# Patient Record
Sex: Female | Born: 1935 | Race: White | Hispanic: No | Marital: Married | Smoking: Never smoker
Health system: Southern US, Community
[De-identification: ages and names within clinical notes are randomized; demographics above are authoritative.]

## PROBLEM LIST (undated history)

## (undated) DIAGNOSIS — Z96649 Presence of unspecified artificial hip joint: Secondary | ICD-10-CM

## (undated) DIAGNOSIS — K042 Pulp degeneration: Secondary | ICD-10-CM

## (undated) DIAGNOSIS — M169 Osteoarthritis of hip, unspecified: Secondary | ICD-10-CM

## (undated) DIAGNOSIS — R4182 Altered mental status, unspecified: Secondary | ICD-10-CM

## (undated) DIAGNOSIS — I43 Cardiomyopathy in diseases classified elsewhere: Secondary | ICD-10-CM

## (undated) DIAGNOSIS — I509 Heart failure, unspecified: Secondary | ICD-10-CM

## (undated) DIAGNOSIS — N185 Chronic kidney disease, stage 5: Secondary | ICD-10-CM

## (undated) DIAGNOSIS — J9620 Acute and chronic respiratory failure, unspecified whether with hypoxia or hypercapnia: Secondary | ICD-10-CM

## (undated) DIAGNOSIS — H349 Unspecified retinal vascular occlusion: Secondary | ICD-10-CM

## (undated) DIAGNOSIS — K5900 Constipation, unspecified: Secondary | ICD-10-CM

## (undated) DIAGNOSIS — N289 Disorder of kidney and ureter, unspecified: Secondary | ICD-10-CM

## (undated) DIAGNOSIS — R7989 Other specified abnormal findings of blood chemistry: Secondary | ICD-10-CM

## (undated) DIAGNOSIS — R0902 Hypoxemia: Secondary | ICD-10-CM

## (undated) DIAGNOSIS — G4700 Insomnia, unspecified: Secondary | ICD-10-CM

## (undated) DIAGNOSIS — K2100 Gastro-esophageal reflux disease with esophagitis, without bleeding: Secondary | ICD-10-CM

## (undated) DIAGNOSIS — R269 Unspecified abnormalities of gait and mobility: Secondary | ICD-10-CM

## (undated) DIAGNOSIS — R1310 Dysphagia, unspecified: Secondary | ICD-10-CM

## (undated) DIAGNOSIS — H4010X0 Unspecified open-angle glaucoma, stage unspecified: Secondary | ICD-10-CM

## (undated) DIAGNOSIS — M810 Age-related osteoporosis without current pathological fracture: Secondary | ICD-10-CM

## (undated) DIAGNOSIS — F329 Major depressive disorder, single episode, unspecified: Secondary | ICD-10-CM

## (undated) DIAGNOSIS — E039 Hypothyroidism, unspecified: Secondary | ICD-10-CM

## (undated) DIAGNOSIS — I2510 Atherosclerotic heart disease of native coronary artery without angina pectoris: Secondary | ICD-10-CM

## (undated) DIAGNOSIS — C4490 Unspecified malignant neoplasm of skin, unspecified: Secondary | ICD-10-CM

## (undated) DIAGNOSIS — M8448XA Pathological fracture, other site, initial encounter for fracture: Secondary | ICD-10-CM

## (undated) DIAGNOSIS — F32A Depression, unspecified: Secondary | ICD-10-CM

## (undated) DIAGNOSIS — D649 Anemia, unspecified: Secondary | ICD-10-CM

## (undated) DIAGNOSIS — I4891 Unspecified atrial fibrillation: Secondary | ICD-10-CM

## (undated) DIAGNOSIS — G43909 Migraine, unspecified, not intractable, without status migrainosus: Secondary | ICD-10-CM

## (undated) DIAGNOSIS — M1610 Unilateral primary osteoarthritis, unspecified hip: Secondary | ICD-10-CM

## (undated) DIAGNOSIS — L84 Corns and callosities: Secondary | ICD-10-CM

## (undated) DIAGNOSIS — I10 Essential (primary) hypertension: Secondary | ICD-10-CM

## (undated) HISTORY — DX: Insomnia, unspecified: G47.00

## (undated) HISTORY — DX: Disorder of kidney and ureter, unspecified: N28.9

## (undated) HISTORY — DX: Major depressive disorder, single episode, unspecified: F32.9

## (undated) HISTORY — DX: Essential (primary) hypertension: I10

## (undated) HISTORY — DX: Depression, unspecified: F32.A

## (undated) HISTORY — PX: ABDOMINAL HYSTERECTOMY: SHX81

## (undated) HISTORY — DX: Unspecified retinal vascular occlusion: H34.9

## (undated) HISTORY — PX: CATARACT EXTRACTION: SUR2

## (undated) HISTORY — DX: Hypothyroidism, unspecified: E03.9

## (undated) HISTORY — DX: Gastro-esophageal reflux disease with esophagitis, without bleeding: K21.00

## (undated) HISTORY — DX: Unspecified atrial fibrillation: I48.91

## (undated) HISTORY — DX: Unspecified abnormalities of gait and mobility: R26.9

## (undated) HISTORY — DX: Pulp degeneration: K04.2

## (undated) HISTORY — DX: Unspecified open-angle glaucoma, stage unspecified: H40.10X0

## (undated) HISTORY — DX: Atherosclerotic heart disease of native coronary artery without angina pectoris: I25.10

## (undated) HISTORY — DX: Osteoarthritis of hip, unspecified: M16.9

## (undated) HISTORY — DX: Anemia, unspecified: D64.9

## (undated) HISTORY — PX: SKIN CANCER EXCISION: SHX779

## (undated) HISTORY — DX: Altered mental status, unspecified: R41.82

## (undated) HISTORY — DX: Chronic kidney disease, stage 5: N18.5

## (undated) HISTORY — DX: Hypoxemia: R09.02

## (undated) HISTORY — DX: Pathological fracture, other site, initial encounter for fracture: M84.48XA

## (undated) HISTORY — DX: Dysphagia, unspecified: R13.10

## (undated) HISTORY — DX: Unspecified malignant neoplasm of skin, unspecified: C44.90

## (undated) HISTORY — DX: Gastro-esophageal reflux disease with esophagitis: K21.0

## (undated) HISTORY — DX: Migraine, unspecified, not intractable, without status migrainosus: G43.909

## (undated) HISTORY — DX: Cardiomyopathy in diseases classified elsewhere: I43

## (undated) HISTORY — DX: Acute and chronic respiratory failure, unspecified whether with hypoxia or hypercapnia: J96.20

## (undated) HISTORY — DX: Heart failure, unspecified: I50.9

## (undated) HISTORY — DX: Corns and callosities: L84

## (undated) HISTORY — DX: Unilateral primary osteoarthritis, unspecified hip: M16.10

## (undated) HISTORY — DX: Presence of unspecified artificial hip joint: Z96.649

## (undated) HISTORY — DX: Age-related osteoporosis without current pathological fracture: M81.0

## (undated) HISTORY — DX: Other specified abnormal findings of blood chemistry: R79.89

## (undated) HISTORY — DX: Constipation, unspecified: K59.00

---

## 1997-10-25 ENCOUNTER — Emergency Department (HOSPITAL_COMMUNITY): Admission: EM | Admit: 1997-10-25 | Discharge: 1997-10-25 | Payer: Self-pay | Admitting: Emergency Medicine

## 1997-10-26 ENCOUNTER — Ambulatory Visit (HOSPITAL_COMMUNITY): Admission: RE | Admit: 1997-10-26 | Discharge: 1997-10-26 | Payer: Self-pay

## 1997-11-12 ENCOUNTER — Ambulatory Visit (HOSPITAL_COMMUNITY): Admission: RE | Admit: 1997-11-12 | Discharge: 1997-11-12 | Payer: Self-pay | Admitting: Internal Medicine

## 1998-11-01 ENCOUNTER — Other Ambulatory Visit: Admission: RE | Admit: 1998-11-01 | Discharge: 1998-11-01 | Payer: Self-pay | Admitting: Internal Medicine

## 1999-01-13 ENCOUNTER — Inpatient Hospital Stay (HOSPITAL_COMMUNITY): Admission: EM | Admit: 1999-01-13 | Discharge: 1999-01-17 | Payer: Self-pay | Admitting: Emergency Medicine

## 1999-01-13 ENCOUNTER — Encounter: Payer: Self-pay | Admitting: Emergency Medicine

## 1999-02-17 ENCOUNTER — Ambulatory Visit (HOSPITAL_COMMUNITY): Admission: RE | Admit: 1999-02-17 | Discharge: 1999-02-17 | Payer: Self-pay | Admitting: Cardiology

## 1999-05-08 ENCOUNTER — Other Ambulatory Visit: Admission: RE | Admit: 1999-05-08 | Discharge: 1999-05-08 | Payer: Self-pay | Admitting: Internal Medicine

## 1999-06-17 ENCOUNTER — Emergency Department (HOSPITAL_COMMUNITY): Admission: EM | Admit: 1999-06-17 | Discharge: 1999-06-17 | Payer: Self-pay | Admitting: Emergency Medicine

## 1999-06-17 ENCOUNTER — Encounter: Payer: Self-pay | Admitting: Emergency Medicine

## 1999-06-24 ENCOUNTER — Emergency Department (HOSPITAL_COMMUNITY): Admission: EM | Admit: 1999-06-24 | Discharge: 1999-06-24 | Payer: Self-pay | Admitting: Emergency Medicine

## 2000-01-06 ENCOUNTER — Encounter: Payer: Self-pay | Admitting: Emergency Medicine

## 2000-01-06 ENCOUNTER — Inpatient Hospital Stay (HOSPITAL_COMMUNITY): Admission: EM | Admit: 2000-01-06 | Discharge: 2000-01-09 | Payer: Self-pay | Admitting: Emergency Medicine

## 2000-03-12 HISTORY — PX: TOTAL HIP ARTHROPLASTY: SHX124

## 2000-12-11 ENCOUNTER — Encounter: Payer: Self-pay | Admitting: Orthopedic Surgery

## 2000-12-18 ENCOUNTER — Inpatient Hospital Stay (HOSPITAL_COMMUNITY): Admission: RE | Admit: 2000-12-18 | Discharge: 2000-12-23 | Payer: Self-pay | Admitting: Orthopedic Surgery

## 2000-12-18 ENCOUNTER — Encounter: Payer: Self-pay | Admitting: Orthopedic Surgery

## 2000-12-23 ENCOUNTER — Inpatient Hospital Stay (HOSPITAL_COMMUNITY)
Admission: RE | Admit: 2000-12-23 | Discharge: 2001-01-06 | Payer: Self-pay | Admitting: Physical Medicine & Rehabilitation

## 2000-12-31 ENCOUNTER — Encounter: Payer: Self-pay | Admitting: Physical Medicine & Rehabilitation

## 2001-01-02 ENCOUNTER — Encounter: Payer: Self-pay | Admitting: Physical Medicine & Rehabilitation

## 2001-01-03 ENCOUNTER — Encounter: Payer: Self-pay | Admitting: Physical Medicine & Rehabilitation

## 2001-03-27 ENCOUNTER — Encounter: Payer: Self-pay | Admitting: Otolaryngology

## 2001-03-27 ENCOUNTER — Ambulatory Visit (HOSPITAL_COMMUNITY): Admission: RE | Admit: 2001-03-27 | Discharge: 2001-03-27 | Payer: Self-pay | Admitting: Otolaryngology

## 2001-04-10 ENCOUNTER — Encounter: Admission: RE | Admit: 2001-04-10 | Discharge: 2001-06-13 | Payer: Self-pay | Admitting: Orthopedic Surgery

## 2001-07-01 ENCOUNTER — Encounter (HOSPITAL_BASED_OUTPATIENT_CLINIC_OR_DEPARTMENT_OTHER): Admission: RE | Admit: 2001-07-01 | Discharge: 2001-09-19 | Payer: Self-pay | Admitting: Orthopedic Surgery

## 2001-07-01 ENCOUNTER — Other Ambulatory Visit: Admission: RE | Admit: 2001-07-01 | Discharge: 2001-07-01 | Payer: Self-pay | Admitting: Internal Medicine

## 2003-12-10 ENCOUNTER — Encounter (HOSPITAL_BASED_OUTPATIENT_CLINIC_OR_DEPARTMENT_OTHER): Admission: RE | Admit: 2003-12-10 | Discharge: 2003-12-15 | Payer: Self-pay | Admitting: Internal Medicine

## 2004-03-15 ENCOUNTER — Encounter (HOSPITAL_BASED_OUTPATIENT_CLINIC_OR_DEPARTMENT_OTHER): Admission: RE | Admit: 2004-03-15 | Discharge: 2004-03-27 | Payer: Self-pay | Admitting: Internal Medicine

## 2004-06-12 ENCOUNTER — Encounter (HOSPITAL_BASED_OUTPATIENT_CLINIC_OR_DEPARTMENT_OTHER): Admission: RE | Admit: 2004-06-12 | Discharge: 2004-09-10 | Payer: Self-pay | Admitting: Surgery

## 2004-09-20 ENCOUNTER — Encounter (HOSPITAL_BASED_OUTPATIENT_CLINIC_OR_DEPARTMENT_OTHER): Admission: RE | Admit: 2004-09-20 | Discharge: 2004-12-19 | Payer: Self-pay | Admitting: Surgery

## 2005-08-30 ENCOUNTER — Ambulatory Visit (HOSPITAL_COMMUNITY): Admission: RE | Admit: 2005-08-30 | Discharge: 2005-08-30 | Payer: Self-pay | Admitting: Internal Medicine

## 2006-03-12 HISTORY — PX: OTHER SURGICAL HISTORY: SHX169

## 2006-07-24 ENCOUNTER — Emergency Department (HOSPITAL_COMMUNITY): Admission: EM | Admit: 2006-07-24 | Discharge: 2006-07-24 | Payer: Self-pay | Admitting: Emergency Medicine

## 2006-08-15 ENCOUNTER — Ambulatory Visit (HOSPITAL_COMMUNITY): Admission: RE | Admit: 2006-08-15 | Discharge: 2006-08-17 | Payer: Self-pay | Admitting: Orthopedic Surgery

## 2009-10-25 ENCOUNTER — Encounter: Admission: RE | Admit: 2009-10-25 | Discharge: 2009-10-25 | Payer: Self-pay | Admitting: Internal Medicine

## 2010-02-27 ENCOUNTER — Ambulatory Visit (HOSPITAL_COMMUNITY)
Admission: RE | Admit: 2010-02-27 | Discharge: 2010-02-27 | Payer: Self-pay | Source: Home / Self Care | Attending: Internal Medicine | Admitting: Internal Medicine

## 2010-07-25 NOTE — Discharge Summary (Signed)
[name], [name]                 ACCOUNT NO.:  [id number]   MEDICAL RECORD NO.:  [id number]          PATIENT TYPE:  OIB   LOCATION:  1620                         FACILITY:  [facility]   PHYSICIAN:  Marlowe Kays, M.D.  DATE OF BIRTH:  30-May-1916   DATE OF ADMISSION:  08/15/2006  DATE OF DISCHARGE:  08/17/2006                               DISCHARGE SUMMARY   ADMITTING DIAGNOSES:  1. Left forearm her left proximal radius fracture.  2. Osteoporosis.  3. History of right total hip replacement in 2005.  4. Multiple syncopal episodes over the past 2 weeks related to      hypertension.  5. Hypertension.  6. Hypothyroidism.  7. Anxiety/depression.   DISCHARGE DIAGNOSES:  1. left proximal radius fracture.  2. Right hip replacement in 2005.  3. Multiple syncopal episodes.  4. Hypertension.  5. Hypothyroidism.  6. Osteoporosis.  7. Anxiety/depression.   CONSULTATIONS:  None.   PROCEDURE:  Open reduction and internal fixation with a 5-hole  compression plate utilizing 2 locking screws and three regular screws,  left radius fracture.  Surgeon was Dr. Marlowe Kays.  Assistant was  Dr. Bradly Bienenstock.   LABORATORY DATA:  Preoperative hemoglobin was 12.3 and remained stable.  She did have some high creatinine at 1.41 preoperatively.  Calcium was  9.6.  Otherwise her labs remained stable.   RADIOLOGY:  Chest two-view showed her heart upper normal in size,  negative for heart failure, infiltrate or effusion.  Lungs were clear.  Mild compression fractures in the dorsal spine are unchanged.  No acute  abnormality.  Intraoperative fluoroscopy was used also.   HOSPITAL COURSE:  The patient was admitted to the hospital due to a left  radial fracture.  Open reduction and internal fixation was performed on  the 5th by Dr. Simonne Come and Dr. Melvyn Novas.  She was admitted to the  orthopedic floor for pain control.  She had a significant amount of pain  to up to a 10/10 rating, which was  uncontrolled with routine medicines.  She does have an allergy to CODEINE medicines.  She was placed on PCA  full-strength morphine drip, which helped to control the pain.  She  remained afebrile throughout her course of stay.  She was  neurovascularly intact in her distal left upper extremity as she  remained immobilized in a sling.  The dressing was checked.  It was  clean, dry and intact throughout her course of stay.  After 3 days we  took her off the PCA pump, gave her some Dilaudid 1 mg by mouth on to  see this helped control the pain and it did so without any adverse  reactions.  So at this point she was ready for discharge to the skilled  nursing facility assisted-living section on p.o. medications for pain  control.   DISCHARGE DISPOSITION:  Discharged to assisted living, skilled nursing  facility, stable and improved condition.   DISCHARGE PHYSICAL THERAPY:  Keep immobilized in sling and cast until  office visit.  She will need a follow-up with Dr. Simonne Come in  approximately 1 week per  chart instructions.   DISCHARGE ACTIVITY:  Keep arm in sling at all times.   DISCHARGE MEDICATIONS:  1. Aspirin 81 mg daily.  2. Norvasc 5 mg daily.  3. Os-Cal 500 mg daily.  4. Synthroid 0.125 mg daily.  5. Fosamax 70 mg weekly.  6. Multivitamin daily.  7. Colace 100 mg one p.o. b.i.d. p.r.n. constipation.  8. Zoloft 50 mg one p.o. q.h.s.  9. Xalatan eye drops 0.005% solution in each eye daily.  10.Dilaudid 1 mg p.o. q.4h. p.r.n. pain.  Utilize this until pain is      well-controlled at a least of dosage amount, at which point then      transfer to Darvocet-N 100 one to two p.o. q.6h. p.r.n. pain.  If      not transitioned per patient request after 5 days, go ahead and      switch on day #5 from discharge time of hospital readmission into      skilled lifting and onto the Darvocet.   DISCHARGE FOLLOW-UP:  1. Dr. Simonne Come at [phone], per approximately 10 days.  2. If the patient  develops acute shortness of breath, severe calf      pain, call emergency services immediately.  3. If the patient develops a temperature greater than 101.5 that is      unresponsive to Tylenol 650 mg q.6h., call office.     ______________________________  Yetta Glassman. [address], [place]    ______________________________  Marlowe Kays, M.D.    BLM/MEDQ  D:  08/17/2006  T:  08/17/2006  Job:  [id number]

## 2010-07-25 NOTE — Op Note (Signed)
[name], [name]                 ACCOUNT NO.:  [id number]   MEDICAL RECORD NO.:  [id number]          PATIENT TYPE:  OIB   LOCATION:  1620                         FACILITY:  [facility]   PHYSICIAN:  Marlowe Kays, M.D.  DATE OF BIRTH:  [birth year]   DATE OF PROCEDURE:  08/15/2006  DATE OF DISCHARGE:                               OPERATIVE REPORT   PREOPERATIVE DIAGNOSIS:  Closed displaced proximal left radial shaft  fracture.   POSTOPERATIVE DIAGNOSIS:  Closed displaced proximal left radial shaft  fracture.   OPERATION:  Open reduction and internal fixation with five hole  compression plate utilizing two locking screws and three regular screws,  left radius fracture.   SURGEON:  Marlowe Kays, M.D.   ASSISTANT:  Madelynn Done, M.D.   ANESTHESIA:  General.   JUSTIFICATION FOR PROCEDURE:  Her injury actually occurred late in the  evening of May 13, early in the morning of May 14.  She was subsequently  seen in the [facility] and referred to see me.  She has  had a laceration on the dorsum of her extensor forearm which was treated  with closure on Jul 26, 2006.  Once the laceration had healed and  sutures had been removed, she is scheduled for surgery today.  She has  been maintained in a long arm fiberglass cast and despite molding, the  fracture has been displaced.  It was felt that ORIF was indicated.   PROCEDURE:  Prophylactic antibiotic, satisfactory general anesthesia,  time out performed.  The cast was previously bivalved and was removed in  the operating room, pneumatic tourniquet applied, and her left upper  extremity was prepped from tourniquet to fingertips with DuraPrep and  draped in a sterile field.  I elected to use a volar approach extending  from roughly the antecubital fossa and the mid forearm along the border  of the brachial radialis. This gave a natural interval down to the  proximal radius. The median nerve and the sensory branch of  the radial  nerve were protected. Major small vessels which prior to the recurrent  radial artery and vein, were doubly clamped, cut and both ends tied with  3-0 Vicryl.   The proximal radius was explored up to the bicipital tuberosity and we  then worked distally, finding the fracture site, had some early callous  formation.  We cleared the fracture site of all soft tissue and  continued to work distally.  We then incised up the bone surface and  proximally there was not enough room for three holes. Consequently,  although we had hoped to use a six hole plate, used a five hole plate.  I used a five hole Dynamic compression plate.  First, we were able to  anatomically reduce the fracture and hold it with the forearm in  supination and I placed the most proximal screw adjacent to the radial  tuberosity and we then placed a second screw immediately distal to the  fracture site.  I then filled in the remaining screws with two being  locking screws.  I  then used C-arm x-rays, checking the length of all  screws, which were just through the dorsal cortex of the radius.  The  fracture appeared to be anatomically reduced.  The wound was well  irrigated with sterile saline and tourniquet released.  There was no  unusual bleeding.  I closed the subcutaneous tissue only with  interrupted 2-0 Vicryl and skin with running 4-0 nylon  tailor stitch.  Betadine, Adaptic, dry sterile dressing, and a long arm  splint cast were applied.  She tolerated the procedure well and was  taken to the recovery room in satisfactory condition with no known  complications.  August 14, 2006           ______________________________  Marlowe Kays, M.D.     JA/MEDQ  D:  08/15/2006  T:  08/15/2006  Job:  [id number]

## 2010-07-27 ENCOUNTER — Emergency Department (HOSPITAL_COMMUNITY): Payer: Medicare Other

## 2010-07-27 ENCOUNTER — Inpatient Hospital Stay (HOSPITAL_COMMUNITY)
Admission: EM | Admit: 2010-07-27 | Discharge: 2010-08-01 | DRG: 690 | Disposition: A | Discharge status: Skilled Nursing Facility | Payer: Medicare Other | Attending: Internal Medicine | Admitting: Internal Medicine

## 2010-07-27 ENCOUNTER — Inpatient Hospital Stay (HOSPITAL_COMMUNITY): Payer: Medicare Other

## 2010-07-27 DIAGNOSIS — R0902 Hypoxemia: Secondary | ICD-10-CM | POA: Diagnosis present

## 2010-07-27 DIAGNOSIS — A498 Other bacterial infections of unspecified site: Secondary | ICD-10-CM | POA: Diagnosis present

## 2010-07-27 DIAGNOSIS — E039 Hypothyroidism, unspecified: Secondary | ICD-10-CM | POA: Diagnosis present

## 2010-07-27 DIAGNOSIS — R32 Unspecified urinary incontinence: Secondary | ICD-10-CM | POA: Diagnosis present

## 2010-07-27 DIAGNOSIS — Z79899 Other long term (current) drug therapy: Secondary | ICD-10-CM

## 2010-07-27 DIAGNOSIS — Z7982 Long term (current) use of aspirin: Secondary | ICD-10-CM

## 2010-07-27 DIAGNOSIS — R269 Unspecified abnormalities of gait and mobility: Secondary | ICD-10-CM | POA: Diagnosis present

## 2010-07-27 DIAGNOSIS — N390 Urinary tract infection, site not specified: Principal | ICD-10-CM | POA: Diagnosis present

## 2010-07-27 DIAGNOSIS — F3289 Other specified depressive episodes: Secondary | ICD-10-CM | POA: Diagnosis present

## 2010-07-27 DIAGNOSIS — Z85828 Personal history of other malignant neoplasm of skin: Secondary | ICD-10-CM

## 2010-07-27 DIAGNOSIS — Z96649 Presence of unspecified artificial hip joint: Secondary | ICD-10-CM

## 2010-07-27 DIAGNOSIS — M810 Age-related osteoporosis without current pathological fracture: Secondary | ICD-10-CM | POA: Diagnosis present

## 2010-07-27 DIAGNOSIS — W19XXXA Unspecified fall, initial encounter: Secondary | ICD-10-CM | POA: Diagnosis present

## 2010-07-27 DIAGNOSIS — S22009A Unspecified fracture of unspecified thoracic vertebra, initial encounter for closed fracture: Secondary | ICD-10-CM | POA: Diagnosis present

## 2010-07-27 DIAGNOSIS — R339 Retention of urine, unspecified: Secondary | ICD-10-CM | POA: Diagnosis not present

## 2010-07-27 DIAGNOSIS — F329 Major depressive disorder, single episode, unspecified: Secondary | ICD-10-CM | POA: Diagnosis present

## 2010-07-27 DIAGNOSIS — I10 Essential (primary) hypertension: Secondary | ICD-10-CM | POA: Diagnosis present

## 2010-07-27 DIAGNOSIS — E860 Dehydration: Secondary | ICD-10-CM | POA: Diagnosis present

## 2010-07-27 LAB — URINALYSIS, ROUTINE W REFLEX MICROSCOPIC
Bilirubin Urine: NEGATIVE
Glucose, UA: NEGATIVE mg/dL
Nitrite: POSITIVE — AB
Protein, ur: NEGATIVE mg/dL
Specific Gravity, Urine: 1.025 (ref 1.005–1.030)
Urobilinogen, UA: 0.2 mg/dL (ref 0.0–1.0)

## 2010-07-27 LAB — CBC
HCT: 35.9 % — ABNORMAL LOW (ref 36.0–46.0)
Platelets: 229 10*3/uL (ref 150–400)
WBC: 9.9 10*3/uL (ref 4.0–10.5)

## 2010-07-27 LAB — BASIC METABOLIC PANEL
BUN: 41 mg/dL — ABNORMAL HIGH (ref 6–23)
CO2: 31 mEq/L (ref 19–32)
Chloride: 106 mEq/L (ref 96–112)
Creatinine, Ser: 1.34 mg/dL — ABNORMAL HIGH (ref 0.4–1.2)
Glucose, Bld: 112 mg/dL — ABNORMAL HIGH (ref 70–99)
Potassium: 4.2 mEq/L (ref 3.5–5.1)

## 2010-07-27 LAB — DIFFERENTIAL
Basophils Relative: 1 % (ref 0–1)
Eosinophils Absolute: 0.2 10*3/uL (ref 0.0–0.7)
Lymphs Abs: 1.2 10*3/uL (ref 0.7–4.0)
Monocytes Absolute: 1.4 10*3/uL — ABNORMAL HIGH (ref 0.1–1.0)
Monocytes Relative: 14 % — ABNORMAL HIGH (ref 3–12)
Neutrophils Relative %: 72 % (ref 43–77)

## 2010-07-27 LAB — URINE MICROSCOPIC-ADD ON

## 2010-07-27 MED ORDER — IOHEXOL 300 MG/ML  SOLN
100.0000 mL | Freq: Once | INTRAMUSCULAR | Status: AC | PRN
  Administered 2010-07-27: 100 mL via INTRAVENOUS

## 2010-07-28 ENCOUNTER — Inpatient Hospital Stay (HOSPITAL_COMMUNITY): Payer: Medicare Other

## 2010-07-28 LAB — CBC
HCT: 35.2 % — ABNORMAL LOW (ref 36.0–46.0)
Hemoglobin: 10.9 g/dL — ABNORMAL LOW (ref 12.0–15.0)
MCV: 95.4 fL (ref 78.0–100.0)
Platelets: 219 10*3/uL (ref 150–400)
WBC: 9.7 10*3/uL (ref 4.0–10.5)

## 2010-07-28 LAB — BASIC METABOLIC PANEL: Creatinine, Ser: 1.1 mg/dL (ref 0.4–1.2)

## 2010-07-28 NOTE — Op Note (Signed)
[facility]  Patient:    Dawn, Abbott Visit Number: [id number] MRN: [id number]          Service Type: Attending:  Illene Labrador. Aplington, M.D. Dictated by:   Marcie Bal Troncale, P.A.-C.   CC:         Janae Bridgeman. Eloise Harman., M.D.  James P. Aplington, M.D.   Operative Report  DATE OF BIRTH:  09-30-1916.  SOCIAL SECURITY NUMBER:  [id number].  SCHEDULED ADMISSION DATE:  December 18, 2000.  CHIEF COMPLAINT:  Right hip pain.  HISTORY OF PRESENT ILLNESS:  [name] is an 77 year old female accompanied by her husband today who presents with complaints of increasing right hip pain since August 2002. She had sustained a right acetabular fracture that was treated conservatively back in October of 2001. This was secondary to a fall. This has healed up well but she was noted at that time to have significant arthritis of the right hip. Over the last few months, though, she has developed increasing right hip pain to the point that she is now wheelchair bound. She has been unable to ambulate secondary to her pain in that hip. Because of the lack of improvement with conservative measures including anti-inflammatory medications, it was discussed that she may benefit from surgical intervention. The patient was interested and wished to proceed with surgery.  REVIEW OF SYSTEMS:  She denies any recent fever or chills. No diplopia or blurred vision or headaches. Does report some clear drainage from the right eye. Also, some occasional sneezing. No rhinorrhea, sore throat, or earaches. No chest pain or shortness of breath. Rare nonproductive cough. No abdominal pain, nausea, vomiting, diarrhea, or constipation. No melena or bright red blood per rectum. No urinary frequency, dysuria, or hematuria. Reports some occasional numbness in both legs as well as some lower extremity swelling.  PAST MEDICAL HISTORY:  Significant for hypertension, gastroesophageal  reflux disease, history of UTIs, thyroid disorder, history of syncopal episodes, anemia, multifactorial hyponatremia, and hypercoaguable state. The patient notes that she has a variable sleeping pattern where she often will sleep during the day and is awake at night.  PAST SURGICAL HISTORY:  D&C, cataract surgery x 2, multiple dental surgeries, and skin cancer removal x 3.  ALLERGIES:  No known drug allergies.  CURRENT MEDICATIONS: 1. Fosamax 70 mg each week. 2. Norvasc 10 mg q.d. 3. Aspirin 81 mg q.d. 4. Synthroid 0.125 mg q.d. 5. Zoloft 100 mg q.d. 6. Tums, vitamins, and _______ .  SOCIAL HISTORY:  She lives with her husband at [facility] in the apartments. Dr. Lendell Caprice is her medical physician. The patient denies tobacco use. She has two adult children who are alive and well.  FAMILY HISTORY:  Significant for coronary artery disease in her brother, he died at age 70 and then another that died in his 24s with heart disease. Father died age 54 with heart disease.  PHYSICAL EXAMINATION:  VITAL SIGNS:  Pulse 72, respirations 16, blood pressure 112/58.  GENERAL:  Well-developed, well-nourished female in no acute distress.  HEENT:  Head is normocephalic, atraumatic. Pupils equal, round, reactive to light and extraocular movements are grossly intact. Oropharynx is clear without redness, exudate, or lesions.  NECK:  Supple with no cervical lymphadenopathy. No carotid bruits.  CHEST:  Clear to auscultation bilaterally with no wheezes or crackles.  HEART:  Regular rate and rhythm with no murmurs, rubs, or gallops.  ABDOMEN:  Soft, nontender, nondistended, with no masses.  BREASTS AND GENITOURINARY:  Not examined  as not pertinent to present illness.  SKIN:  Intact without rashes or lesions.  EXTREMITIES:  2+ pitting edema in both lower extremities. She has 1+ dorsalis pedis pulse on the right. Motor and sensory are grossly intact in both lower extremities. On  extremity exam, she has no internal or external rotation on the right hip. 45 degrees of hip flexion but does so with significant pain with attempts at range of motion.  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 There is also evidence of the old acetabular fracture.  IMPRESSION: 1. Osteoarthritis, right hip. 2. Hypercoaguable state. 3. Hypertension. 4. Gastroesophageal reflux disease. 5. Thyroid disorder. 6. Anemia. 7. Multifactorial hyponatremia. 8. History of urinary tract infections. 9. History of syncopal episodes.  PLAN: The patient will be admitted to [facility] to undergo a right total hip arthroplasty by Dr. Simonne Come on December 18, 2000. Dr. Simonne Come has spoken with Dr. Lendell Caprice who felt the patient was an acceptable candidate for surgery. Their discussion recommended that we use Lovenox starting 12 hours after surgery and use this until her Coumadin kicks in and is therapeutic. All questions have been encouraged and answered for the patient. Dictated by:   Marcie Bal Troncale, P.A.-C. Attending:  Illene Labrador. Aplington, M.D. DD:  12/10/00 TD:  12/10/00 Job: [id number]

## 2010-07-28 NOTE — H&P (Signed)
[facility]  Patient:    [name], BLUMENSTEIN Visit Number: [id number] MRN: [id number]          Service Type: Attending:  Illene Labrador. Aplington, M.D. Dictated by:   Irena Cords, P.A.-C. Adm. Date:  12/10/00                           History and Physical  ADDENDUM  DATE OF BIRTH:  1916-11-02  SOCIAL SECURITY NUMBER:  [id number]  PREVIOUS DICTATION NUMBER:  [phone]  The patient does live at [facility] in the apartments with her husband. She is scheduled to go to one of the health care rooms after her discharge from the hospital.  Apparently, they have physical therapy on site at this location where she can receive her physical therapy as well as additional nursing care. Dictated by:   Irena Cords, P.A.-C. Attending:  Illene Labrador. Aplington, M.D. DD:  12/10/00 TD:  12/10/00 Job: [id number]

## 2010-07-28 NOTE — Discharge Summary (Signed)
[place]  Patient:    CANDYCE, GAMBINO                          MRN: [id number] Adm. Date:  [id number] Disc. Date: 01/09/00 Attending:  Marlowe Kays Page Dictator:   Alexzandrew L. Julien Girt, P.A.-C. CC:         Janae Bridgeman. Eloise Harman., M.D.  James P. Aplington, M.D.   Discharge Summary  ADDENDUM:  Below are a list of continued instructions.  We have some medication changes after speaking with Dr. Lendell Caprice concerning her diet.  She may be on a regular to low sodium diet.  She will also need to be on fluid restrictions.  She needs to only have five glasses of fluid per day fluid restrictions.  Concerning medications, the demeclocycline will be 300 mg q.8h. This is for seven days only.  That was not in the original discharge summary. This medication will be for seven days only.  Concerning her Zoloft, this medication has not been tapered fully.  When she returns to the skilled unit, she will need to remain on Zoloft 25 mg p.o. q.d. for three days only, and then she will stop this medication and then do not resume further Zoloft, but she will need to be on it for three more days after return to the skilled care facility. DD:  01/09/00 TD:  01/09/00 Job: [id number]

## 2010-07-28 NOTE — Discharge Summary (Signed)
[place]  Patient:    [name], [name]                          MRN: [id number] Adm. Date:  [id number] Disc. Date: 01/09/00 Attending:  Marlowe Kays Page Dictator:   Alexzandrew L. Julien Girt, P.A.-C. CC:         Janae Bridgeman. Eloise Harman., M.D.   Discharge Summary  ADMISSION DIAGNOSES: 1. Right hip acetabular fracture. 2. Hypertension. 3. Hypothyroidism. 4. 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 5. Hypercoagulable state with ill-defined antibody at this time. 6. 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 7. Hyponatremia, multifactoral.  DISCHARGE DIAGNOSES: 1. Right hip acetabular fracture. 2. Hypertension. 3. Hypothyroidism. 4. 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 5. Hypercoagulable state with ill-defined antibody at this time. 6. 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 7. Hyponatremia, multifactoral.  PROCEDURE:  None.  CONSULTING PHYSICIANS:  Janae Bridgeman. Eloise Harman., M.D.  BRIEF HISTORY:  The patient is an 77 year old female and is a resident of [facility] [address] with small vessel disease of the brain as well as syncopal episodes and collapse.  The patient fell on the morning of admission of January 06, 2000.  She was brought to the emergency room and referred over by Dr. Dimas Alexandria.  X-rays taken on admission showed a fracture of the acetabulum.  X-rays were reviewed.  Dr. Marlowe Kays was oncall for [facility].  The patient was seen and admitted for bed rest and treatment of the fractures.  It was decided due to the type of fractures, that these would be treated nonsurgical.  She is placed at bedrest and placed in Bucks tractions for comfort.  LABORATORY DATA:  CBC on admission; hemoglobin 11.2, hematocrit 31.8, white count 12.3, red cell count 3.64.  Differential  showed neutrophils 87, lymphs 6, monos 5, eos 1, basos 0.  Follow-up CBC on January 07, 2000, showed a hemoglobin of 10.4, hematocrit 30.0, white count normal at 10.1.  CMET on admission showed a low sodium of 128, chloride 95, elevated glucose 132, decreased albumin at 3.1, remaining chem panel within normal limits. Follow-up BMET on January 07, 2000, showed a lower sodium of 124, low chloride 94, glucose holding at 131, calcium down to 7.6.  TSH level of 1.852. Urinalysis on admission was negative.  Blood type A positive.  X-RAYS:  Pelvic film; complex right acetabular fracture likely involving both anterior and posterior columns, fracture planes demonstrated at least mild to moderate displacement, no associated proximal femur fracture, no associated acute pelvic fractures identified.  Right hip film showed right acetabular fracture, no associated femoral fracture identified.  Chest x-ray; no active process of the chest.  Right knee; no fractures.  I do not see an EKG report.  HOSPITAL COURSE:  The patient was admitted to [facility] for the above stated problems, placed at bed rest and placed into 5 pounds of Bucks traction for comfort.  This was placed on the right lower extremity. Due to the patients history a medical consult was called in.  The patient was seen in consultation by Dr. Dimas Alexandria.  It was felt the fall was possibly due to use of soporific drugs including Ambien that may or may not have a possible amnestic side effect relative to the fall.  [place]  was held.  The patient also has a history of a hypercoagulable state and also prior history of syncopal episode with collapse which she has been worked up in the past for.  She has been on low dose 81 mg aspirin.  It was also noted that she had a probable multifactoral hyponatremia and this could possibly have been exentuated by the use of her Zoloft during the hospital course.  The Zoloft was  weaned and then stopped.  She was placed at bed rest from an orthopedic standpoint.  She was initially started on IV analgesics for pain and then switched over to p.o. analgesics.  She has been using Darvocet one or two every four to six hours as needed for pain.  She is also placed on Lovenox for DVT prophylaxis.  She will likely require continued using of Lovenox after the hospitalization and then switched over to an aspirin product.  Physical therapy was consulted.  The patient started to get out of bed.  This was initiated over the weekend.  It was very difficult for the patient to maintain nonweightbearing to the right lower extremity, however, she was able to get up and out of bed to the chair with assistance. Based on recommendations it was felt that she would require skilled care.  Discharge planning was consulted. The patient was seen and evaluated. Prior to her fall she was a resident in independent living at [facility].  Discharge planning assisted in locating a bed on the skilled unit at [facility].  This bed was available on January 09, 2000.  This was discussed with Dr. Simonne Come and also with Dr. Lendell Caprice and both felt that the patient was appropriate for transfer at this time.  It was decided that the patient would be discharged over to [facility] skilled care unit on January 09, 2000.  DISCHARGE PLAN:  The patient will be transferred over to the skilled unit at [facility] on January 09, 2000.  DISCHARGE DIAGNOSES:  Please see above discharge diagnoses.  DISCHARGE MEDICATIONS:  1. Colace 100 mg p.o. b.i.d.  2. Multivitamin p.o. q.d.  3. Provera 2.5 mg p.o. q.d.  4. Levothyroxine 125 mcg p.o. q.d.  5. Premarin 0.625 mg p.o. q.d.  6. Lotensin 10 mg p.o. q.d.  7. Continue Lovenox 30 mg subcu injection q.12h. for only 14 days.  Treatment     was initiated on January 07, 2000.  8. Demeclocycline 300 mg p.o. q.8h.  9. Tylenol one or two every  four to six hours for mild pain.  10. Darvocet-N 100 one or two every four to six hours as needed for pain.  No Demerol.  No Ambien, Ambien is on hold.  The Zoloft is on hold, do not resume.  Following the cessation of her Lovenox after 14 days of treatment, it was encouraged for the patient to take one enteric-coated aspirin 325 mg daily until stopped as per Dr. Simonne Come at which point she stops the 325 mg, she may resume her 81 mg tablet.  DIET:  Low sodium diet.  ACTIVITY:  The patient needs to remain nonweightbearing to the right lower extremity when she is in bed.  5 pounds of Bucks traction to the right lower extremity as for comfort.  The Bucks traction boot will need to be removed from the leg b.i.d. for skin checks and heel checks to prevent skin breakdown. If there is a change in her condition, please contact the office at [phone]. The  patient will need to be out of bed at least q.d., if not b.i.d. as per physical therapy.  Please continue with nonweightbearing bed to chair transfers.  The patient will also be allowed short ambulation if able to tolerate and if able to maintain nonweightbearing to the right lower extremity.  If she is unable to maintain her nonweightbearing status with ambulation, please continue at least bed to chair transfers with stand and pivot.  FOLLOW-UP:  The patient will need to follow up with Dr. Fayrene Fearing Aplington in approximately two weeks following discharge.  Please contact the office at [phone] to arrange appointment time and arrange transfer for this patient. She will need to follow up with Dr. Dimas Alexandria.  Please contact his office for appointment and time.  DISCHARGE INSTRUCTIONS:  Due to the hyponatremia, the patient will need to have a BMET basic metabolic panel drawn early Thursday morning, the date of January 11, 2000.  Please have these results faxed over to Dr. Edwena Felty office Thursday afternoon.  His fax number is  [phone].  If the patient does need some assistance with a sleep aid, may use a low dose Benadryl 25 mg at night, but do not resume her Ambien.  DISPOSITION:  The patient will be transferred to [facility] skilled care unit.  CONDITION ON DISCHARGE:  Stable. DD:  01/09/00 TD:  01/09/00 Job: [id number]

## 2010-07-28 NOTE — Discharge Summary (Signed)
[facility]  Patient:    [name], [name] Visit Number: [id number] MRN: [id number]          Service Type: [facility] Location: [id number] Attending Physician:  Faith Rogue T Dictated by:   Irena Cords, P.A.-C. Admit Date:  12/23/2000 Disc. Date: 12/23/00   CC:         [name]. [name]., M.D.  Daniel L. Thomasena Edis, M.D.   Discharge Summary  PRINCIPAL DIAGNOSES: 1. Osteoarthritis right hip, status post right total hip arthroplasty. 2. Hyponatremia. 3. Postoperative anemia.  SECONDARY DIAGNOSES: 1. Hypercoagulable state. 2. Hypertension. 3. Gastroesophageal reflux disease. 4. Thyroid disorder. 5. Anemia. 6. Multifactorial hyponatremia. 7. History of urinary tract infections. 8. History of syncopal episodes.  SURGICAL PROCEDURE:  Right total knee arthroplasty by Dr. Simonne Come with the assistance of Dr. Ranell Patrick on December 18, 2000.  Please see operative summary for further details.  CONSULTATIONS:  Dr. Thomasena Edis in physical medicine rehabilitation.  LABORATORY DATA:  Admission CBC showed a white count of 7.4, hemoglobin and hematocrit of 11.6 and 34.3, respectively.  This dropped to a low on December 20, 2000, of 8.4 and 23.9, respectively.  Prior to discharge this had improved to 10.4 and 30.1.  PT and INR on admission 13.2 and 1.0.  Prior to discharge, 20.7 and 2.2.  Chemistry on admission BUN of 31, creatinine of 1.3, sodium and potassium of 141 and 4.0, respectively.  She did have decrease in her sodium down to 130 on October 12, this improved to 135 on discharge.  Her potassium had also decreased to 3.3 on December 21, 2000, but had improved back to 3.6 on discharge.  Urinalysis on December 11, 2000, did show moderate leukocyte esterase.  On December 18, 2000, parameters were all negative.  Repeat on December 22, 2000, was also negative with the exception of a large amount of hemoglobin.  Also note on December 11, 2000, that urinalysis showed  that she had 21 to 50 white blood cells.  Chest x-ray on December 11, 2000, negative for active disease with just chronic changes.  EKG also on December 11, 2000, showed normal sinus rhythm.  CHIEF COMPLAINT:  Right hip pain.  HISTORY OF PRESENT ILLNESS:  [name] is an 77 year old female who presents with the complaint of increasing right hip pain since August 2002.  She had a right acetabular fracture that was treated conservatively in October 2001. This had healed up as she was noted to have significant arthritis of that hip. Over the last few months she has had increasing right hip pain.  She is now wheelchair bound.  She had tried conservative measures, including anti-inflammatory medications without improvement.  Because of the interference with her activities of daily living, it was recommended that she would benefit from surgical intervention.  Dr. Simonne Come spoke with Dr. Lendell Caprice, the patients medical physician, who felt she was an acceptable candidate for surgery.  They did recommend that the patient be on Lovenox after surgery until her Coumadin became therapeutic given her hypercoagulable state.  HOSPITAL COURSE:  Following the surgical procedure, the patient was taken to the PACU in stable condition, transferred to the orthopedic floor in good condition.  She did well during her hospital stay.  She was up with physical therapy touchdown weightbearing.  It was noted during her surgery that she had a small crack in her greater trochanter.  She was fitted with a hip knee ankle orthosis to be utilized with weightbearing.  She was also started  on Lovenox in the interval until her Coumadin became therapeutic.  By the day of discharge she was therapeutic on her Coumadin.  She also developed some hyponatremia postoperatively and had fluid restriction which brought her sodium back up to normal range.  Also she was noted to have some postoperative anemia on postoperative day #2, and  was transfused 2 units of packed red blood cells.  She was initiated on PCA morphine for pain management, and then subsequently Vicodin p.o. which she tolerated well and controlled her pain well.  Wound was examined on postoperative day #2, and found to be clean, dry, and intact without signs or symptoms of infection.  Dr. Thomasena Edis in physical medicine rehabilitation was consulted for consideration of a SACU versus rehabilitation stay.  He did feel she was an acceptable candidate.  On postoperative day #3, the patient was still with a positive fluid balance.  By postoperative day #5, all parameters had improved.  Her hemoglobin and hematocrit were stable, and she was therapeutic on her Coumadin.  Her sodium and potassium were also within normal limits.  She was no longer positive in her fluid balance.  She was stable and ready for discharge.  DISPOSITION:  The patient is going to be discharged to [facility] for continued rehabilitation.  DIET:  Low sodium diet.  FOLLOWUP:  She is to follow up with Dr. Simonne Come approximately two weeks postoperatively.  Call [phone] for an appointment.  ACTIVITY:  Touchdown weightbearing.  She is to utilize the brace in bed, as well as with ambulation.  Once daily dressing changes.  DISCHARGE MEDICATIONS: 1. Coumadin per pharmacy dosing for a total of 6 weeks postoperatively. 2. Vicodin 5/500 mg one or two q.4-6h. p.r.n. pain. 3. Robaxin 500 mg p.o. q.8h. p.r.n. spasm. 4. Synthroid 125 mcg p.o. q.d. 5. Senokot one or two tabs p.o. q.h.s. 6. Zoloft 100 mg p.o. q.d. 7. Norvasc 5 mg p.o. q.d. 8. Multivitamin with iron one p.o. q.d.  CONDITION ON DISCHARGE:  Good and improved. Dictated by:   Irena Cords, P.A.-C. Attending Physician:  Faith Rogue T DD:  12/23/00 TD:  12/23/00 Job: [id number]

## 2010-07-28 NOTE — Discharge Summary (Signed)
[place]  Patient:    [name], [name] Visit Number: [id number] MRN: [id number]          Service Type: [facility] Location: [phone] Attending Physician:  Faith Rogue T Dictated by:   Junie Bame, P.A. Admit Date:  12/23/2000 Disc. Date: 01/06/01   CC:         Fayrene Fearing P. Aplington, M.D.  Daniel L. Thomasena Edis, M.D.  Janae Bridgeman. Eloise Harman., M.D.   Discharge Summary  DISCHARGE DIAGNOSES: 1. 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 2. Hypertension. 3. Hypothyroidism. 4. Depression. 5. Urinary retention. 6. Improving pneumonia. 7. Urinary tract infection.  HISTORY OF PRESENT ILLNESS:  The patient is an 77 year old white female admitted on December 18, 2000, with a history of increased hip pain since August 2002.  The patient suffered an acetabular fracture in October 2001, and was treated conservatively after fall.  The patient underwent a right total hip replacement on December 18, 2000, by Dr. Simonne Come due to traumatic arthritis. The patient is on Coumadin for DVT prophylaxis.  PT report at that time indicated the patient was total assist to feet to chair with standard walker. The patient posturally is 40%.  She can transfer sit-to-stand with modified assist.  The patients hospital course was significant for hyponatremia and anemia secondary to blood loss.  The patient was transfused two units of packed red blood cells.  PAST MEDICAL HISTORY: 1. Hypertension. 2. GERD. 3. Multiple UTIs. 4. Thyroid disorder. 5. Syncopal episode. 6. Abnormal sleeping pattern. 7. Hypertension.  PAST SURGICAL HISTORY: 1. Cataract surgery x3. 2. Skin cancer removal x3.  ALLERGIES:  No known drug allergies, but she is intolerant to VICODIN and ANAPROX.  MEDICATIONS PRIOR TO ADMISSION: 1. Zoloft 100 mg p.o. q.d. 2. Synthroid 125 mcg q.d. 3. Norvasc 10 mg q.d. 4. Aspirin 81 mg q.d. 5. Ambien ___ p.r.n. 6.  Multivitamin.  SOCIAL HISTORY:  The patient lives at [facility] with husband.  She has two adult children, alive and well.  No history of alcohol or tobacco use. Husband undergoing outpatient radiation right now for prostate cancer.  REVIEW OF SYSTEMS:  Significant for occasional numbness in legs.  Denies any chest pain, shortness of breath, or headache.  FAMILY HISTORY:  Noncontributory.  HOSPITAL COURSE:  [name] was admitted to [facility] on December 23, 2000, for comprehensive inpatient rehabilitation where she received more than three hours of PT and OT daily.  [name] made very slow progress during rehabilitation due to different medical issues.  Hospital course is significant for urinary retention, pneumonia, and vaginal yeast infection.  The patient remained on Lovenox 30 mg subcutaneous q.12 h. until the INR was therapeutic and Coumadin during the entire stay in rehabilitation. At the time of rehabilitation, the patient was in a brace which made it difficult for the patient to ambulate and transfer.  Due to hip adductor brace, the patient had difficulty with ambulation and with transfers.  On December 25, 2000, due to significant urinary retention the patient was started on Flomax 0.4 mg q.h.s. and Urecholine 25 mg t.i.d.  She was advised to try to toilet at least every three to four hours while awake.  Urinary retention did finally resolve after several days of treatment.  We had to increase the Flomax to 0.8 mg on December 30, 2000.  On December 31, 2000, the patient had an elevated temperature and experienced nausea and vomiting.  Chest x-ray was ordered,  and it demonstrated left lower consolidation suspicious for pneumonia; therefore, the patient was started on Cipro 500 mg b.i.d. x14 days on January 01, 2001.  On January 02, 2001, while doing therapies in gym, the patient fell secondary to dizziness.  X-rays were ordered of the right  hip which demonstrated no dislocations or fractures.  Also on January 02, 2001, Dr. Simonne Come agreed to discontinue hip adductor brace and just have patient adhere to strict hip precautions.  Her weightbearing status should remain touchdown  weightbearing as tolerated.  Orthostatic blood pressures were ordered for two days but showed no orthostasis.  The patient remained on Protonix 40 mg p.o. q.d. for GI protection and Zoloft 100 mg p.o. q.d. for depression.  At the time of admission the patient was placed on Trinsicon p.o. b.i.d. for anemia.  Repeat chest x-ray was ordered on January 03, 2001, to evaluate pneumonia.  This latest chest x-ray demonstrated improved atelectasis and improved infiltrates.  Latest laboratories indicate the patient had a sodium of 139, potassium of 4.1, BUN of 23, creatinine of 1.2.  Hemoglobin was 9.1, hematocrit was 27.3, platelet count was 736.  Repeat urine culture was performed on December 31, 2000, which demonstrated greater than 100,000 colonies of Enterobacter. The patient is already presently on Cipro for pneumonia.  Urine culture came back one positive out of three.  Another time, at the time of discharge, surgical incision demonstrated no signs of infection.  Staples were removed, and Steri-Strips were applied.  PT report indicated the patient was ambulating min assist to modified assist and could perform most ADLs with modified assist.  The patient is still touchdown weightbearing.  Vital signs were stable.  The rest of the physical exam was unremarkable.  DISCHARGE MEDICATIONS:  1. Norvasc 10 mg p.o. q.d.  2. Senokot-S 2 tablets p.o. ______ .  3. Zoloft 100 mg p.o. q.d.  4. Multivitamin with minerals p.o. q.d.  5. Protonix 40 mg p.o. q.d.  6. Claritin 10 mg p.o. q.d.  7. Flomax 0.8 mg p.o. ______ .  8. Trinsicon 1 tablet p.o. b.i.d.   9. Betadine to heels topically twice daily. 10. Cipro 500 mg p.o. q.d. 11. Synthroid 125 mcg p.o. q.d. 12.  Ensure p.o. b.i.d. 13. Laxative of choice. 14. Enema of choice. 15. Tylenol 325 to 650 mg p.o. q.4-6h. if needed. 16. Coumadin ______ p.o. q.d. 17. Desyrel 50 mg p.o. q.h.s. p.r.n. 18. Robaxin 50 mg p.o. q.6h. p.r.n. for spasms. 19. Benadryl 25 p.o. q.6h. as needed for itching. 20. Oxycodone 5-10 mg p.o. q.4-6h. if needed.  ACTIVITY:  The patient is to use her walker.  She is touchdown weightbearing.  FOLLOW-UP:  The patient is to follow up with Dr. Simonne Come within one to two weeks.  She is to follow up with primary care Calder Oblinger, Dr. Dimas Alexandria, within four to six weeks.  Follow up with Dr. Faith Rogue if needed. Dictated by:   Junie Bame, P.A. Attending Physician:  Faith Rogue T DD:  01/03/01 TD:  01/03/01 Job: [id number]

## 2010-07-28 NOTE — Discharge Summary (Signed)
[place]  Patient:    SHALYN, KORAL Visit Number: [id number] MRN: [id number]          Service Type: [facility] Location: [phone] Attending Physician:  Faith Rogue T Dictated by:   Junie Bame, P.A. Admit Date:  12/23/2000 Disc. Date: 01/06/01   CC:         Fayrene Fearing P. Aplington, M.D.  Janae Bridgeman. Eloise Harman., M.D.   Discharge Summary  DISCHARGE DIAGNOSES: 1. 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 2. Hypertension. 3. Pneumonia. 4. Depression. 5. Hypothyroidism. 6. Urinary retention. 7. Urinary tract infection. Dictated by:   Junie Bame, P.A. Attending Physician:  Faith Rogue T DD:  01/03/01 TD:  01/03/01 Job: [id number]

## 2010-07-28 NOTE — Op Note (Signed)
[facility]  Patient:    [name], [name] Visit Number: [id number] MRN: [id number]          Service Type: SUR Location: 4W [id number] 01 Attending Physician:  Marlowe Kays Page Proc. Date: 12/18/00 Admit Date:  12/18/2000                             Operative Report  PREOPERATIVE DIAGNOSIS:  Traumatic arthritis, right hip.  POSTOPERATIVE DIAGNOSIS:  Traumatic arthritis, right hip.  OPERATION:  Osteonics total hip replacement, right.  SURGEON:  Illene Labrador. Aplington, M.D.  ASSISTANT:  Malon Kindle, M.D.  ANESTHESIA:  General.  PATHOLOGY AND JUSTIFICATION FOR PROCEDURE:  She sustained an acetabular fracture on the right over a year ago.  She has developed progressive pain in the right groin and hip area with x-rays demonstrating healing of the acetabular fracture with some protrusio and about an inch and a half shortening of her right leg.  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  Because of her failure to respond to nonsurgical treatment, she is here today for surgical reconstruction with total hip replacement.  DESCRIPTION OF PROCEDURE:  Prophylactic antibiotics, satisfactory general anesthesia, Foley catheter inserted, left lateral decubitus position using the Mark II frame, the right hip was prepped with DuraPrep and draped in a sterile field.  Ioban employed.  Posterolateral incision down to the fascia lata. Zickels band was cut.  The gluteus medius partially detached and tagged with #1 Ethibond.  The external rotators were detached off the femur with Bovie dissection.  The capsule was identified and opened.  After freeing up the capsule as much as we could because of protrusio, we were then able to dislocate the hip, and I made my initial femoral neck cut just below the femoral head.  I was beginning to clean out the piriformis fossa soft tissue when we noted some movement of the greater trochanter, and she clinically at least,  had a stable but definite fracture of a portion of the greater trochanter.  We were unsure when this had occurred.  Her hip was very tight, and we had tried to rotate it for exposure.  At no time did we note any particular noise to indicate that the fracture had occurred.  I then placed a guidepin through the piriformis fossa down in the femur, over-drilled it, and then used a canal finder followed by the cylindrical reamers, working up to a 7 and along the way used both the greater trochanteric reamer and also made my final femoral neck cut, roughly a fingerbreadth or slightly more above the lesser trochanter.  I wanted to maintain as much length as we could.  I then performed a partial capsulectomy and began the rasping process, working up to a 6 rasp which appeared to be the appropriate size.  Measured her canal for cement plug at #3, and this was placed.  Then returned to the acetabulum where the capsulectomy was completed, cleaned the acetabulum of fibrous tissue with large curette and rongeur.  She had deformity of the acetabulum and because of the obvious thin wall, I made an initial drill hole centrally, found it to be just a few millimeters thick.  We then cautiously began about the deepening and expanding reaming, working first with 48, trying to remove as much of the incongruity and fibrous tissue as possible.  During this process, we actually left an area of the posterior  acetabular wall where we were down to soft tissue and even though the remainder of the acetabulum was not as perfectly smooth as I would like, I had to stop the expanding reaming at that point.  I then used morcellized bone from the femoral head and our reamings and of the canal and the acetabulum to pack the acetabulum with this morcellized bone and then went through a trial reduction.  We had reamed up to a 64 cup.  This seemed a little loose and since we could not expand the acetabulum any more, we went with  a 66 trial, and this actually seemed to have a nice, snug fit. We checked for the position with a 10 degree trial with the scribe line at 11 oclock, and the hip was nice and stable.  Accordingly, I went ahead and used the final 66 mm Omnifit microstructured acetabular shell which I secured with two screws vertically placed up into the ileum.  These were individually drilled and with a depth gauge, I checked to be sure that they were in bone and then measured, and I used two 40 mm cancellous screws.  This seemed to give a nice, secure fit to the acetabulum.  We then went through another trial reduction and found the position to be stable with the 10 degree cup once again scribed at 11 oclock and accordingly, I went ahead and placed the fin al acetabular liner at this position.  I then returned to the femur which I water-picked and packed with an adrenalin-soaked sponge as the methyl methacrylate was being mixed and then dried the canal and inserted the methyl methacrylate with a glue gun and glove technique, placing the final size 6 Omnifit Eon plus stem and trimmed up excess methyl methacrylate while it was hardening.  I then went through another trial reduction using this time at plus 5 neck and found that the hip was nice and stable in this position and did not appear to be overly tight.  I felt that this would be best to try and regain the 1.5 cm shortening.  After checking range of motion, the final plus 5 C-tapered head was placed, the hip reduced and found to be stable.  The wound was then well-irrigated with antibiotic solution as we had throughout the case.  Zickels band was reconstituted with interrupted #1 Vicryl, gluteus medius with #1 Ethibond, and external rotators with #1 Vicryl.  The fascia lata was then closed with #1 Vicryl, subcutaneous tissue with a combination of 0 and 2-0 Vicryl and the skin with staples.  Betadine, Adaptic dry sterile dressing were applied.  She was  gently placed on her back in an abduction pillow and taken to the recovery room in satisfactory condition with no known complications other than the previously-noted stable fracture of the greater  trochanter.  Estimated blood loss was 1200 cc, given two units of bank blood. Attending Physician:  Joaquin Courts DD:  12/18/00 TD:  12/18/00 Job: [id number]

## 2010-07-28 NOTE — Consult Note (Signed)
[facility]  Patient:    [name], [name] Visit Number: [id number] MRN: [id number]          Service Type: [facility] Location: FOOT Attending Physician:  Nadara Mustard Dictated by:   Jonelle Sports Cheryll Cockayne, M.D. Proc. Date: 04/10/01 Admit Date:  04/10/2001   CC:         [facility]  Janae Bridgeman. Eloise Harman., M.D.   Consultation Report  HISTORY OF PRESENT ILLNESS:  This 77 year old white female is seen on referral from [facility] for assistance with management of a decubitus ulcer of the right heel.  The patient apparently has a history of syncopal episodes and in fact fell and broke her hip in spring of 2002. She was managed with extended traction therapy both at [facility] and also at [facility]. Then in October 2002 she was hospitalized for right total hip replacement. The aforementioned fracture apparently had been acetabular in nature. She had a satisfactory result of that surgery but apparently during that hospitalization and convalescence developed bilateral decubiti on the heels. She has been treated at [facility] apparently using primarily Accuzyme and hydrogel dressings. The lesion on the left heel has resolved, but that on the right persists and still has substantial shaggy exudate.  She is accordingly referred here now for our evaluation and management.  PAST MEDICAL HISTORY:  It is notable for hypertension, small-vessel cerebrovascular disease, hyperthyroidism, GERD, retinal vein occlusions, and some urinary tract difficulties.  ALLERGIES:  She is intolerant to VICODIN, ANAPROX, HYDROCODONE.  REGULAR MEDICATIONS:  1. Protonix.  2. Trazodone.  3. Robaxin.  4. Benadryl.  5. APAP.  6. Oxycodone.  7. Vitamins.  8. Flomax.  9. Fosamax. 10. Norvasc. 11. Zoloft. 12. Claritin. 13. Synthroid. 14. Aspirin.  PHYSICAL EXAMINATION:  Examination today is limited to the lower extremities. The feet are  without gross deformity, and there is no significant edema. The pedal pulses are palpable but diminished, and skin temperatures are reasonable and symmetrical with the exception of some increase in skin temperature of some 7 or 8 degrees in comparison with the contralateral side on the right heel where there is open ulceration. There is no evidence of active cellulitis. Monofilament testing shows that she has protective sensation throughout both feet. There is no significant callus formation and no broken skin other than the primary ulcer.  This ulcer on the posterior aspect of the left heel rather than on the weight-bearing surface, following my attention, measures 21 x 14 x 2.5 mm. The base is involved with a quite shaggy, yellowish exudate and eschar.  IMPRESSION:  Decubitus ulcer right heel with chronic shaggy eschar.  DISPOSITION: 1. The patient is placed in a lateral position, and the wound base is    sharply debrided. With her being fully sensate, there is some discomfort    to her but not unmanageable. It is possible to remove most of the shaggy    eschar and to be left with a relatively clean wound base. 2. The wound will be dressed with Accuzyme, covered with wet-to-dry saline    dressing, and this will be changed daily. 3. The patient will return in 1 week. If there has been striking progress,    likely the same method of treatment will be continued. If not, and    this seems more likely, we will at that time do initial debridement    if indicated and change to wound VAC therapy. 4. Follow-up visit  will be to this clinic in 1 week. Dictated by:   Jonelle Sports Cheryll Cockayne, M.D. Attending Physician:  Nadara Mustard DD:  04/10/01 TD:  04/10/01 Job: [id number]

## 2010-07-28 NOTE — H&P (Signed)
[facility]  Patient:    [name], [name]                          MRN: [id number] Adm. Date:  [id number] Disc. Date: [id number] Attending:  Marlowe Kays Page Dictator:   Druscilla Brownie. Underwood III, P.A.C. CC:         Janae Bridgeman. Eloise Harman., M.D.   History and Physical  CHIEF COMPLAINT:  "Pain in my right hip."  PRESENT ILLNESS:  This 77 year old lady, who is a resident of [facility] [address], with a history of small-vessel disease of the brain as well as syncopal episodes and collapse, fell this morning in her residence.  She was brought to the emergency room and was referred to [place] by Dr. Dimas Alexandria, her family physician.  X-rays taken of the left hip showed a minimally-displaced fracture through the proximal femoral head.  There is also fracture of the table of the acetabulum, as well.  After x-rays were carefully, reviewed, it was decided this was not an operable problem at this time, that we would approach it conservatively. She will be placed in [facility] traction.  We will ask Dr. Dimas Alexandria to follow along with [place] medically, and he may decide to do further workup concerning her syncopal episodes. Dr. Lendell Caprice had her in the hospital in November 2000 for syncopal episodes and collapse.  PAST MEDICAL HISTORY:  This lady has had multiple fractures about the pelvis and sacrum in the past due to falls.  She currently is being treated by Dr. Lendell Caprice for hypertension, hypothyroidism, and syncopal episodes with Meclizine.  OTHER MEDICATIONS:  Zoloft, Synthroid, Lotensin, Prempro, and Ambien.  ALLERGIES:  She has no medical allergies but is intolerant to ANAPROX and VICODIN.  SOCIAL HISTORY:  The patient is married, lives at [facility], and is an Manufacturing systems engineer.  FAMILY HISTORY:  Noncontributory.  REVIEW OF SYSTEMS:  CNS:  The patient does have vertigo, intermittent, with syncopal episodes and occasional collapse with falls.  Her  husband states she has been diagnosed at [facility] of Neurology with small-vessel brain disease.  RESPIRATORY:  No productive cough or hemoptysis, no shortness of breath.  CARDIOVASCULAR:  No chest pain, no angina, no orthopnea.  GASTROINTESTINAL:  No nausea, vomiting, melena, or bloody stools. The patient does have constipation intermittently.  GENITOURINARY:  No discharge, dysuria, hematuria.  MUSCULOSKELETAL:  The patient has osteoarthritis in the spine, as well as the hips, but does get about.  She obviously has hip pain consistent with PI.  PHYSICAL EXAMINATION:  GENERAL:  She is an alert and cooperative, fully oriented 77 year old white female, seen lying on an emergency room stretcher.  She is accompanied by her husband.  VITAL SIGNS:  Blood pressure 189/84, pulse 84, respirations 20, temperature 97.4.  HEENT:  Normocephalic, oropharynx is clear.  No scalp lesions.  NECK:  Supple.  CHEST:  Clear to auscultation.  No rhonchi or rales.  HEART:  Regular rate and rhythm, no murmurs are heard.  ABDOMEN:  Soft, nontender, liver and spleen not felt.  Bowel sounds present.  GENITALIA/RECTAL/PELVIC/BREAST:  Not done, not pertinent to present illness.  EXTREMITIES:  The right lower extremity has pain with gentle rocking.  No range of motion is elicited.  Pulses are 2+ and equal in the lower extremities and sensory is intact.  ADMITTING DIAGNOSES: 1. Right femoral head/acetabular fracture. 2. Hypertension. 3. Hypothyroidism. 4. Small-vessel cerebral disease with history of  syncope and collapse.  PLAN:  The patient will be placed at bedrest.  [facility] traction as tolerated. Pain medications and decide on her level of activity, beginning with transfers in the next several days or weeks.  In all probability, she will need skilled care when she returns to [facility].  All of this is explained to her husband.  We will certainly ask Dr. Dimas Alexandria or  one of his associates to follow closely this patients medical condition, and particularly with the history of recurrent syncopal episodes and then these frequent falls.  We will have her watched closely by him and his associates. DD:  01/06/00 TD:  01/06/00 Job: [id number]

## 2010-07-29 ENCOUNTER — Inpatient Hospital Stay (HOSPITAL_COMMUNITY): Payer: Medicare Other

## 2010-07-29 LAB — BASIC METABOLIC PANEL
BUN: 24 mg/dL — ABNORMAL HIGH (ref 6–23)
Chloride: 108 mEq/L (ref 96–112)
GFR calc non Af Amer: 50 mL/min — ABNORMAL LOW (ref >60)
Potassium: 3.8 mEq/L (ref 3.5–5.1)
Sodium: 147 mEq/L — ABNORMAL HIGH (ref 135–145)

## 2010-07-30 LAB — URINE CULTURE
Colony Count: >=100000
Culture  Setup Time: 201205172020

## 2010-07-31 ENCOUNTER — Inpatient Hospital Stay (HOSPITAL_COMMUNITY): Payer: Medicare Other

## 2010-07-31 LAB — CBC
HCT: 35.4 % — ABNORMAL LOW (ref 36.0–46.0)
Hemoglobin: 11.2 g/dL — ABNORMAL LOW (ref 12.0–15.0)
MCV: 93.4 fL (ref 78.0–100.0)
RBC: 3.79 MIL/uL — ABNORMAL LOW (ref 3.87–5.11)
WBC: 8 10*3/uL (ref 4.0–10.5)

## 2010-08-01 LAB — CBC
HCT: 33.8 % — ABNORMAL LOW (ref 36.0–46.0)
Hemoglobin: 10.6 g/dL — ABNORMAL LOW (ref 12.0–15.0)
MCH: 29.4 pg (ref 26.0–34.0)
MCHC: 31.4 g/dL (ref 30.0–36.0)
MCV: 93.9 fL (ref 78.0–100.0)
RBC: 3.6 MIL/uL — ABNORMAL LOW (ref 3.87–5.11)

## 2010-08-01 NOTE — Discharge Summary (Signed)
[name], [name]                 ACCOUNT NO.:  [id number]  MEDICAL RECORD NO.:  [id number]           PATIENT TYPE:  I  LOCATION:  5123                         FACILITY:  [facility]  PHYSICIAN:  Pleas Koch, MD        DATE OF BIRTH:  [birth year]  DATE OF ADMISSION:  07/27/2010 DATE OF DISCHARGE:                              DISCHARGE SUMMARY   DATE OF DISCHARGE:  To be determined.  HISTORY OF PRESENT ILLNESS:  Briefly, this is a 77 year old female with past medical history of hypothyroid, complained of back pain for the past 2 weeks since falling.  Denies numbness or tingling.  She has dull ache and pain in her back, it is worse with standing and she needed 2 nurses to get her up and she had significant pain on her right side. She is given 2 medications for coughing, which she has now picked up. She does have some sputum without fever and some chills.  She has been eating reasonably well and unable to go with rolling walker.  She has left-sided pain when she moves around that is not pleuritic.  Please see my full admission dictation [id number] for full detail of other parts of admitting history.  PHYSICAL EXAMINATION:  In the ED, temperature was 98.5, blood pressure 153/73, pulse rate 95, respirations 15, O2 sat 91%.  The patient was sent over from primary care physician because right eye seems little watery.  No hypertensive changes.  No JVD.  No TVR, TBF.  No dullness to percussion.  Some tenderness over lower back and tibs.  No thyromegaly.  She has CVA tenderness, but I am unsure if this is secondary to pain or not.  LABORATORY DATA:  WBC was 9, hemoglobin 11.3, hematocrit 35.9.  BMP was essentially normal except mild elevation in BUN and creatinine 41 and 1.34.  Chest x-ray showed entire bony thorax.  Hip x-ray showed mild protrusion and degenerative changes.  Initial lab, EKG showed rapid R- wave progression and flat T-wave, no voltage complexes, rate of 84, PR interval of 0.2.   UA was compatible with urinary tract infection.  She was started on IV Rocephin in the ED.  HOSPITAL COURSE: 1. Urinary tract infection-E.coli=  She was started on IV Rocephin.  This was     transitioned on Jul 30, 2010, to Bactrim DS.  She will continue on     this for another 3 days hopefully.  As the sensitivities did return     which showed sensitivity to Bactrim. 2. Elevated D-dimer and Multi-level Back Fractures=  The patient had elevated D-dimer, which prompted     CTA of the chest.  CTA of the chest subsequently then showed severe     T9 compression fracture and subsequent x-rays and films after     Orthopedics was consulted showed MRA which included severe burst     fracture L1 which is chronic with new fracture at T11     demonstrated on recent chest film which had an obliqueslant     relatively multilevel spondylosis of high-grade spinal stenosis and  possible cholelithiasis.  She had thoracolumbar film done on Jul 31, 2010, which showed slightly increased compression of T1-T11     vertebrae since prior chest CT, no visible protrusion of bone     elements in spinal canal and no other changes. Orthopedics Dr. Victorino Dike, was consulted during hospitalization and he recommended when I discussed with him on Jul 31, 2010, that she is able to tolerate PT/OT and is able to sit up right.  It will be reasonable for her to progress with therapy as an outpatient.  As such, we are planning for discharge on the a.m. of Aug 01, 2010.  She has also had an orthotic TLS placed by [employer] and has been cleared to be weightbearing as tolerated by PT and OT to assess the same.  4. Depression.  The patient will continue sertraline and temazepam as     an outpatient. 5. Hypoxemia was likely secondary to poor inspiratory effort.  She has     been given incentive spirometry here in the hospital and her sats     were above 90. 6. Because of her hypertension, which appears to be uncontrolled      likely secondary to pain, she has been started on Coreg 3.125     b.i.d. and will continue on the same as an outpatient.  I will     reconcile her meds on day of discharge and hopefully she will be     able to go home.  I have had full discussions with both her sons     and her daughter regarding the patient's care and they are in     agreement and have been updated entirely.  On review today, temperature is 98, pulse is 91, respirations are 18, blood pressure is 119-126 over 47-66 and satting 95% on room air.  She was not eating that well and seemed a little bit more ill today and was a little warm and really wants to sit up.  Abdomen was soft, nontender, nondistended.  Chest was clear.  Extremities were soft.  She had no pallor.  Please note that she has also had a Foley placed because she developed urinary tract infection, which may be likely secondary to pain from being in the hospital.  She did not have any saddle anesthesia or any other issues and will likely recover fully.  It has been a pleasure taking care of this pleasant lady.          ______________________________ Pleas Koch, MD  I reviewed patient independantly again on 08/01/10-she was feeling much better and has still not been seen by PT/Ot-I will await thier assesment and determine the amount of pain that she is in prior to my discharging her back to [facility].  If  she is able to ambulate moderatley well, I will go ahead and discharge her  to the Facility, otherwise I believe it would be prudent to have Orthopedics re-evaluate her subsequen tto Pt/Ot evaluatiion and she will likle ythen be D/c to Skilled part of  her nursing facility.  A formal addendum of discharge medication will be done agfter I have this evaluatiion done by Pt/Ot  --SIgnature--   JS/MEDQ  D:  07/31/2010  T:  07/31/2010  Job:  [id number]  Electronically Signed by Pleas Koch MD on 08/01/2010 08:56:19 AM

## 2010-08-08 NOTE — Consult Note (Signed)
[name], [name]                 ACCOUNT NO.:  [id number]  MEDICAL RECORD NO.:  [id number]           PATIENT TYPE:  I  LOCATION:  5123                         FACILITY:  [facility]  PHYSICIAN:  Toni Arthurs, MD        DATE OF BIRTH:  [birth year]  DATE OF CONSULTATION:  07/28/2010 DATE OF DISCHARGE:                                CONSULTATION   CHIEF COMPLAINT:  Back pain.  HISTORY OF PRESENT ILLNESS:  The patient is a 77 year old female with past medical history significant for hypothyroidism, who complains of back pain since falling 2 weeks ago.  She denies numbness, tingling, or weakness in her lower extremities.  She complains of dull aching pain in her back that is worse with sitting up or standing and is better with lying flat.  She was recently admitted for an urinary tract infection. I am consulted for evaluation and treatment of her back pain.  She has never had back surgery before.  She has had hip replacement by Dr. Simonne Come about 15 years ago.  She does complain of some constipation, but denies any peroneal numbness or difficulty with bladder function.  PAST MEDICAL HISTORY:  Hypertension, hypothyroidism, depression.  PAST SURGICAL HISTORY:  Skin cancer excisions in the early 90s, bilateral cataract surgeries, total hip replacement in 2002, ORIF of left radius fracture in 2008.  SOCIAL HISTORY:  The patient lives in an assisted living facility for the past 15 years.  This is a [facility].  She does not use any tobacco products or drink any alcohol.  FAMILY HISTORY:  Positive for coronary artery disease in an uncle and is otherwise negative.  REVIEW OF SYSTEMS:  As above and otherwise negative.  PHYSICAL EXAM:  The patient is an elderly thin woman, in no apparent distress, alert and oriented x4.  Mood and affect normal.  Extraocular motions are intact.  Respirations are unlabored.  Her gait currently is nonweightbearing.  She seen lying supine in bed.  She has  5/5 strength at her iliopsoas, quadriceps, ankle dorsiflexors, ankle plantar flexors, and EHLs bilaterally.  She has 1+ reflexes at the knees and ankles. There is no clonus.  She has intact sensibility to light touch throughout both lower extremities.  Skin is healthy and intact over her back.  There is no lymphadenopathy noted in her lower extremities.  She has diffuse tenderness to palpation at the lower thoracic spine and lumbar spine.  X-RAYS:  CT of her chest had been obtained to evaluate for pulmonary embolism.  This showed compression fractures of T8, T9, and T11.  No imaging has been obtained for lumbar spine to date.  ASSESSMENT:  T8, T9, and T11 compression fractures and a patient with no neurologic dysfunction.  PLAN:  Explained the nature of the injuries to the patient.  At this point, we are going to immobile her in a TLS orthotic.  I am also going to order upright thoracic and lumbar spine films.  The patient understands this plan and agrees.  This is a complex problem with high risk for degrading function in this elderly patient.  Toni Arthurs, MD     JH/MEDQ  D:  07/28/2010  T:  07/29/2010  Job:  [id number]  Electronically Signed by Toni Arthurs  on 08/08/2010 01:38:46 PM

## 2010-08-09 NOTE — Consult Note (Signed)
  NAMEKEANNA, TUGWELL                 ACCOUNT NO.:  [id number]  MEDICAL RECORD NO.:  [id number]           PATIENT TYPE:  I  LOCATION:  5123                         FACILITY:  [facility]  PHYSICIAN:  Bertram Millard. Kyce Ging, M.D.DATE OF BIRTH:  1916-08-06  DATE OF CONSULTATION:  07/30/2010 DATE OF DISCHARGE:                                CONSULTATION   REASON FOR CONSULTATION:  Difficult catheterization.  BRIEF HISTORY:  A 77 year old female admitted for medical issues. Catheter placement was attempted, and unsuccessful.  She is a 77 year old female and had some vaginal transfer atrophic changes.  Urologic consultation was requested.  I placed a 20.16 [place] Coude tip catheter under sterile conditions. 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  Once I got the catheter in, clear urine was obtained.  The balloon was filled and the catheter was dependent drainage.  I will recommend that catheter be left until the patient is more ambulatory and improved.  She can follow up p.r.n.     Bertram Millard. Retta Diones, M.D.     SMD/MEDQ  D:  07/30/2010  T:  07/31/2010  Job:  [id number]  Electronically Signed by Marcine Matar M.D. on 08/09/2010 12:53:34 PM

## 2010-08-22 NOTE — Discharge Summary (Signed)
  [name], [name]                 ACCOUNT NO.:  [id number]  MEDICAL RECORD NO.:  [id number]           PATIENT TYPE:  I  LOCATION:  5123                         FACILITY:  [facility]  PHYSICIAN:  Pleas Koch, MD        DATE OF BIRTH:  10-09-1916  DATE OF ADMISSION:  07/27/2010 DATE OF DISCHARGE:                              DISCHARGE SUMMARY   Please see initial discharge dictation [id number] for details.  DISCHARGE MEDICATIONS: 1. Tylenol extra strength 500 mg 2 tablets q. 6 p.r.n. 2. Triamcinolone/Cetaphil topically one application daily p.r.n. 3. Sertraline 50 mg 1 tablet daily. 4. Temazepam 15 mg 1 cap at bedtime prescription given for 30 tablets.  NEW MEDICATION: 1. Carvedilol 3.125 mg b.i.d. with meals. 2. Bisacodyl 5 mg 1-2 tablets daily p.r.n. 3. MiraLax 8 grams daily. 4. Senokot 8.6 mg 2-4 tablets at bedtime. 5. Multivitamins 1 tablet daily. 6. Tramadol 50 mg q.4 p.r.n. prescription given for 60 tablets 10-day     supply. 7. Aspirin 81 mg daily. 8. Glucosamine/chondroitin 500/400 1 tablet daily. 9. Xalatan left eye 0.005% 1 drop at bedtime. 10.Carafate 20 mg 1 cap daily. 11.Os-Cal 500 mg 1 tablet daily. 12.Bactrim 160/800 1 tablet b.i.d. for 2 more days.  The patient given     4 tablets on discharge. 13.Synthroid 125 mg 1 tablet daily. 14.ICaps over-the-counter 1 cap b.i.d. 15.Cholecalciferol 1000 units b.i.d.  The patient reviewed on discharge was doing better. VITAL SIGNS:  Temperature 98, pulse 81, respirations 18, blood pressure 154/71 to 118/56, 93% on 2 liters. GENERAL:  Alert, oriented. CHEST:  Clinically clear. ABDOMEN:  Soft, nontender, nondistended.  Still some pain with sitting up. EXTREMITIES:  No lower extremity edema. NEUROLOGIC:  No neurological deficits.  We will discuss with family members prior to discharge course of care will need to see PT/OT prior to discharge and likely discharge home if able to tolerate standing without pain, otherwise will  need to stay in the hospital and be reviewed by Dr. Victorino Dike of orthopedics to ensure that no other surgical intervention is needed.  The patient will need also to keep on the brace that the TLSO brace that she had been placed on long-term at least 2 weeks until she review with Dr. Victorino Dike, if she does go home.  Ambulance transport will be organized.  We will clamp Foley today and see if she has sensation to void, if not she will be discharged home with Foley.          ______________________________ Pleas Koch, MD     JS/MEDQ  D:  08/01/2010  T:  08/01/2010  Job:  [id number]  Electronically Signed by Pleas Koch MD on 08/22/2010 03:38:07 PM

## 2010-08-22 NOTE — H&P (Signed)
NAMECHANEE, HENRICKSON                 ACCOUNT NO.:  [id number]  MEDICAL RECORD NO.:  [id number]           PATIENT TYPE:  E  LOCATION:  [facility]                         FACILITY:  [facility]  PHYSICIAN:  Pleas Koch, MD        DATE OF BIRTH:  1916/04/21  DATE OF ADMISSION:  07/27/2010 DATE OF DISCHARGE:                             HISTORY & PHYSICAL   PRIMARY CARE PHYSICIAN:  Lenon Curt. Chilton Si, M.D.  ADMISSION DIAGNOSIS:  Feeling weak with pain in hips and chest.  HISTORY OF PRESENT ILLNESS:  The patient is a very pleasant 77 year old female with no real past medical history other than hypothyroidism and possible hypertension who comes in from the assisted living where she stays at [facility] [place] with feeling short of breath and having a cough per PCP's note.  It is noted that she that she had a fall Saturday night and needed two nurses to get her up, on Monday she was getting worse where she has significant pain on the right side.  Has been coughing for a week and Dr. Chilton Si gave her two medications which she has not yet picked up.  Comfort keepers came 3 times a week and they thought the cough is improving.  She does have sputum with cough and no fever or chills.  She has been eating reasonably well and unable to go with the roller walker for short distance and sit down.  She has been getting along well with other walker before the fall without difficulty and has had left-sided rib pain when she moves her arms, it is not pleuritic. She has had bad cough.  PAST MEDICAL HISTORY: 1. Hypothyroidism. 2. Depression. 3. Possible migraine. 4. Dental pulp degeneration. 5. Callus corns. 6. Gait disorder. 7. Urinary incontinence. 8. Vertebral column fracture.  PAST SURGICAL HISTORY: 1. Skin cancer in 1993 and 1990. 2. Bilateral cataract extractions and lens implant. 3. She had total right hip replaced in 2002. 4. Left proximal radial fracture repair in 2008. 5. Her mammogram is up to date  in 2007.  ALLERGIES:  The patient has no real drug allergies.  The patient is allergic to, 1. ANAPROX. 2. HYDROCODONE.  SOCIAL HISTORY:  The patient lives at [facility] for the past 15 years.  She lives in independent section with her husband.  She gets around with help of a walker and she is fairly independent with some of her activity.  REVIEW OF SYSTEMS:  She denies dysuria.  Denies chest pain.  Denies fever.  Denies frequency.  Denies foul smell in the urine.  She states that she gets dizzy occasionally mainly in the morning and lasts up to an hour.  She states this has been happening for the past couple of weeks.  She also states significant constipation.  FAMILY HISTORY:  Significant for coronary artery disease in her uncle and with hypertension and her past medical history is significant for MI in uncle at the age of 26.  Most relatives lived well until their old age there.  Does not appear to be in any other significant medical history.  MEDICATIONS:  Seem to have not been reconciled, but these include aspirin, calcium, Cetaphil, Dulcolax, glucosamine, ICaps, MiraLax, multivitamins, Prilosec, Senokot, sertraline, Synthroid, temazepam, tramadol, Tylenol, and Xalatan.  PHYSICAL EXAMINATION:  VITAL SIGNS:  The patient's temperature in the ED was 98.5, blood pressure 153/73, pulse rate was 95, and respirations 16. The patient's O2 sats were noted to be low at 91% and this was another reason why she was sent over from her primary care physician. GENERAL:  The patient is very pleasant, alert, oriented Caucasian female.  She has no pallor.  No icterus. HEENT:  Her that right eye seems a little watery.  I am able to appreciate the fundus.  I do not appreciate any change of hypertension or diabetes.  Throat is clear.  Moderate dentition. NECK:  Supple and soft.  I do not appreciate any JVD. CHEST:  Clear.  She has no tactile vocal resonance or fremitus.  She has no dullness  to percussion.  She does have some tenderness over the lower ribs on the left side, specifically in the sixth through the tenth rib. I do not appreciate any thyromegaly. HEART:  S1, S2.  No murmurs, rubs, or gallops noted.  Heart sounds seems a little distant. ABDOMEN:  Soft with slightly tender in the lower quadrant.  She does have CVA tenderness, but I am not sure if this is secondary to her rib pain or if this may be secondary to the UTI. EXTREMITIES:  Soft, nontender.  She does have a healing heel ulcer on the right side.  She has no edema.  LABORATORY DATA:  On admission CBC with differential, WBC 9.9, hemoglobin 11.3, hematocrit 35.9, and platelet count 229.  BMP showed sodium 145, potassium 4.2, chloride 106, CO2 of 31, BUN 41, creatinine 1.34, and calcium 9.4.  Urinalysis showed moderate leukocytes, positive nitrites, and many epithelial cells and many bacteria, wbc is 11-20.  IMAGING DATA:  Chest x-ray one-view showed mild central vascular congestion, no infiltrates, edema, or effusions.  Intact bony thorax. Hip x-ray on Jul 27, 2010, showed degenerative change and mild protrusion involving left hip.  No plain film findings or acute fracture.  EMERGENCY DEPARTMENT COURSE:  The patient was placed on oxygen in the ED.  Urine culture was sent and Rocephin 1 gram was given.  Her EKG shows a rate of 84, PR interval 0.20 making almost first-degree AV block, access of about 30 degrees.  Rapid R-wave progression in V2, sort of flat T-waves, low voltage complexes in the precordial leads, possible right atrial enlargement.  Not having old EKG for comparison.  IMPRESSION AND PLAN: 1. Possible urinary tract infection.  The patient did start Rocephin 1     g IV.  For her urinary tract infection, she will be placed on     Rocephin IV.  She has been started on Rocephin IV which we will     continue.  We will continue this until her urine cultures come back     to determine what is being  treated in our spectrum from there.  Her     white count does not show any evidences of stomach inflammatory     response.  However, the specimen I have been assured by the ED     physician was a in-and-out cath. 2. Volume depletion.  The patient likely has some element of     dehydration secondary to this as evidenced by her BMP showing BUN     of 41 over  1.34.  We will keep her on IV fluids at 75 mL an hour     for now. 3. Chest and shoulder pain.  We will keep on morphine low-dose for     pain.  She is already on tramadol at home, but may benefit from     being on this. 4. Hypothyroidism.  The patient will continue on home dose of     Synthroid once her medications are reconciled. 5. Depression.  The patient will continue on sertraline and temazepam. 6. Coronary artery disease prevention.  The patient will continue on     aspirin. 7. Hypertension.  The patient seems to be on no blood pressure     medications at present.  I will place hydralazine with parameters     if her blood pressure rises above 190/110. 8. Hypoxemia.  I will get a D-dimer to ensure that there is no     pulmonary embolism, although she is at low risk for the same.  If     positive, we will get CT angiogram of chest. 9. Prophylaxis.  The patient to be kept on SCDs.  I have confirmed with the patient that she is a full code.  Her husband is a next of kin, his name is Chelsa Stout and his number is [phone]. Her son's name is Harvie Heck and his number is [phone].          ______________________________ Pleas Koch, MD     JS/MEDQ  D:  07/27/2010  T:  07/27/2010  Job:  [id number]  cc:   Lenon Curt. Chilton Si, M.D.  Electronically Signed by Pleas Koch MD on 08/22/2010 03:37:52 PM

## 2010-12-13 ENCOUNTER — Encounter: Payer: Self-pay | Admitting: Gastroenterology

## 2010-12-20 ENCOUNTER — Ambulatory Visit (INDEPENDENT_AMBULATORY_CARE_PROVIDER_SITE_OTHER): Payer: Medicare Other | Admitting: Gastroenterology

## 2010-12-20 ENCOUNTER — Encounter: Payer: Self-pay | Admitting: Gastroenterology

## 2010-12-20 VITALS — BP 136/78 | HR 60 | Ht 64.0 in | Wt 121.0 lb

## 2010-12-20 DIAGNOSIS — R1310 Dysphagia, unspecified: Secondary | ICD-10-CM | POA: Insufficient documentation

## 2010-12-20 NOTE — Progress Notes (Signed)
[name] is a 77 year old white female referred at the request of Dr. Chilton Si for evaluation of dysphagia. Over the last few weeks she's developed dysphagia to solids. She occasionally has to immediately vomited up food that doesn't seem to go down. She denies odynophagia. She has occasional pyrosis.      Past Medical History  Diagnosis Date  . Hypothyroidism   . Depression   . Migraines   . Dental pulp degeneration   . Corns and callus   . Gait disorder   . Urinary incontinence   . Skin cancer   . Cataract   . Skin cancer   . Insomnia    Past Surgical History  Procedure Date  . Total hip arthroplasty   . Cataract extraction   . Abdominal hysterectomy     reports that she has never smoked. She has never used smokeless tobacco. She reports that she does not drink alcohol or use illicit drugs. family history is negative for Colon cancer.  Current medications and social history were reviewed in [facility] electronic medical record  Review of Systems: She complains of anxiety, back pain, confusion, depression headaches loss of hearing muscle pains and shortness of breath for which he takes oxygen Pertinent positive and negative review of systems were noted in the above HPI section. All other review of systems were otherwise negative.  Vital signs were reviewed in today's medical record Physical Exam: General: Well developed , well nourished, no acute distress Head: Normocephalic and atraumatic Eyes:  sclerae anicteric, EOMI Ears: Normal auditory acuity Mouth: No deformity or lesions Neck: Supple, no masses or thyromegaly Lungs: Clear throughout to auscultation Heart: Regular rate and rhythm; no murmurs, rubs or bruits Abdomen: Soft, non tender and non distended. No masses, hepatosplenomegaly or hernias noted. Normal Bowel sounds Rectal:deferred Musculoskeletal: Symmetrical with no gross deformities  Skin: No lesions on visible extremities Pulses:  Normal pulses  noted Extremities: No clubbing, cyanosis, edema or deformities noted Neurological: Alert oriented x 4, grossly nonfocal Cervical Nodes:  No significant cervical adenopathy Inguinal Nodes: No significant inguinal adenopathy Psychological:  Alert and cooperative. Normal mood and affect

## 2010-12-20 NOTE — Patient Instructions (Signed)
YOUR ENDOSCOPY IS SCHEDULED ON 12/25/2010 AT 8AM AT [facility] ENDOSCOPY DEPARTMENT

## 2010-12-20 NOTE — Assessment & Plan Note (Addendum)
Symptoms may be due to a fixed esophageal stricture from either peptic disease or possible malignancy. She could also be suffering from esophageal dysmotility and problems with deglutation  Recommendations #1 upper endoscopy with dilatation as indicated  Risks, alternatives, and complications of the procedure, including bleeding, perforation, and possible need for surgery, were explained to the patient.  Patient's questions were answered.

## 2010-12-25 ENCOUNTER — Ambulatory Visit (HOSPITAL_COMMUNITY)
Admission: RE | Admit: 2010-12-25 | Discharge: 2010-12-25 | Disposition: A | Discharge status: Home / Self Care | Payer: Medicare Other | Source: Ambulatory Visit | Attending: Gastroenterology | Admitting: Gastroenterology

## 2010-12-25 ENCOUNTER — Encounter: Payer: Medicare Other | Admitting: Gastroenterology

## 2010-12-25 DIAGNOSIS — R1310 Dysphagia, unspecified: Secondary | ICD-10-CM | POA: Insufficient documentation

## 2010-12-28 LAB — BASIC METABOLIC PANEL
BUN: 37 — ABNORMAL HIGH
CO2: 31
Calcium: 9.6
Chloride: 102
Creatinine, Ser: 1.41 — ABNORMAL HIGH
GFR calc Af Amer: 43 — ABNORMAL LOW
Glucose, Bld: 116 — ABNORMAL HIGH

## 2010-12-28 LAB — APTT: aPTT: 27

## 2010-12-28 LAB — HEMOGLOBIN AND HEMATOCRIT, BLOOD: HCT: 36.4

## 2011-01-07 ENCOUNTER — Emergency Department (HOSPITAL_COMMUNITY): Payer: Medicare Other

## 2011-01-07 ENCOUNTER — Inpatient Hospital Stay (HOSPITAL_COMMUNITY)
Admission: EM | Admit: 2011-01-07 | Discharge: 2011-01-16 | DRG: 280 | Disposition: A | Discharge status: Skilled Nursing Facility | Payer: Medicare Other | Attending: Internal Medicine | Admitting: Internal Medicine

## 2011-01-07 DIAGNOSIS — N179 Acute kidney failure, unspecified: Secondary | ICD-10-CM | POA: Diagnosis present

## 2011-01-07 DIAGNOSIS — M47814 Spondylosis without myelopathy or radiculopathy, thoracic region: Secondary | ICD-10-CM | POA: Diagnosis present

## 2011-01-07 DIAGNOSIS — Z87440 Personal history of urinary (tract) infections: Secondary | ICD-10-CM

## 2011-01-07 DIAGNOSIS — B961 Klebsiella pneumoniae [K. pneumoniae] as the cause of diseases classified elsewhere: Secondary | ICD-10-CM | POA: Diagnosis present

## 2011-01-07 DIAGNOSIS — I429 Cardiomyopathy, unspecified: Secondary | ICD-10-CM | POA: Diagnosis present

## 2011-01-07 DIAGNOSIS — N184 Chronic kidney disease, stage 4 (severe): Secondary | ICD-10-CM | POA: Diagnosis present

## 2011-01-07 DIAGNOSIS — J9600 Acute respiratory failure, unspecified whether with hypoxia or hypercapnia: Secondary | ICD-10-CM | POA: Diagnosis present

## 2011-01-07 DIAGNOSIS — J9690 Respiratory failure, unspecified, unspecified whether with hypoxia or hypercapnia: Secondary | ICD-10-CM | POA: Diagnosis not present

## 2011-01-07 DIAGNOSIS — A498 Other bacterial infections of unspecified site: Secondary | ICD-10-CM | POA: Diagnosis present

## 2011-01-07 DIAGNOSIS — R1310 Dysphagia, unspecified: Secondary | ICD-10-CM | POA: Diagnosis present

## 2011-01-07 DIAGNOSIS — N390 Urinary tract infection, site not specified: Secondary | ICD-10-CM | POA: Diagnosis present

## 2011-01-07 DIAGNOSIS — I10 Essential (primary) hypertension: Secondary | ICD-10-CM | POA: Diagnosis present

## 2011-01-07 DIAGNOSIS — E039 Hypothyroidism, unspecified: Secondary | ICD-10-CM | POA: Diagnosis present

## 2011-01-07 DIAGNOSIS — F329 Major depressive disorder, single episode, unspecified: Secondary | ICD-10-CM | POA: Diagnosis present

## 2011-01-07 DIAGNOSIS — I129 Hypertensive chronic kidney disease with stage 1 through stage 4 chronic kidney disease, or unspecified chronic kidney disease: Secondary | ICD-10-CM | POA: Diagnosis present

## 2011-01-07 DIAGNOSIS — I480 Paroxysmal atrial fibrillation: Secondary | ICD-10-CM

## 2011-01-07 DIAGNOSIS — E870 Hyperosmolality and hypernatremia: Secondary | ICD-10-CM | POA: Diagnosis present

## 2011-01-07 DIAGNOSIS — F0390 Unspecified dementia without behavioral disturbance: Secondary | ICD-10-CM | POA: Diagnosis present

## 2011-01-07 DIAGNOSIS — Z66 Do not resuscitate: Secondary | ICD-10-CM | POA: Diagnosis present

## 2011-01-07 DIAGNOSIS — Z681 Body mass index (BMI) 19 or less, adult: Secondary | ICD-10-CM

## 2011-01-07 DIAGNOSIS — I509 Heart failure, unspecified: Secondary | ICD-10-CM | POA: Diagnosis present

## 2011-01-07 DIAGNOSIS — D72829 Elevated white blood cell count, unspecified: Secondary | ICD-10-CM | POA: Diagnosis present

## 2011-01-07 DIAGNOSIS — I4891 Unspecified atrial fibrillation: Secondary | ICD-10-CM | POA: Diagnosis present

## 2011-01-07 DIAGNOSIS — M8448XA Pathological fracture, other site, initial encounter for fracture: Secondary | ICD-10-CM | POA: Diagnosis present

## 2011-01-07 DIAGNOSIS — Z7982 Long term (current) use of aspirin: Secondary | ICD-10-CM

## 2011-01-07 DIAGNOSIS — F3289 Other specified depressive episodes: Secondary | ICD-10-CM | POA: Diagnosis present

## 2011-01-07 DIAGNOSIS — I214 Non-ST elevation (NSTEMI) myocardial infarction: Principal | ICD-10-CM | POA: Diagnosis present

## 2011-01-07 DIAGNOSIS — J90 Pleural effusion, not elsewhere classified: Secondary | ICD-10-CM | POA: Diagnosis present

## 2011-01-07 DIAGNOSIS — M810 Age-related osteoporosis without current pathological fracture: Secondary | ICD-10-CM | POA: Diagnosis present

## 2011-01-07 DIAGNOSIS — E440 Moderate protein-calorie malnutrition: Secondary | ICD-10-CM | POA: Diagnosis present

## 2011-01-07 DIAGNOSIS — Z8781 Personal history of (healed) traumatic fracture: Secondary | ICD-10-CM

## 2011-01-07 DIAGNOSIS — Z96649 Presence of unspecified artificial hip joint: Secondary | ICD-10-CM

## 2011-01-07 DIAGNOSIS — I2589 Other forms of chronic ischemic heart disease: Secondary | ICD-10-CM | POA: Diagnosis present

## 2011-01-07 DIAGNOSIS — R1312 Dysphagia, oropharyngeal phase: Secondary | ICD-10-CM | POA: Diagnosis present

## 2011-01-07 DIAGNOSIS — I5021 Acute systolic (congestive) heart failure: Secondary | ICD-10-CM | POA: Diagnosis present

## 2011-01-07 LAB — CBC
HCT: 35.7 % — ABNORMAL LOW (ref 36.0–46.0)
Hemoglobin: 10.7 g/dL — ABNORMAL LOW (ref 12.0–15.0)
MCHC: 30 g/dL (ref 30.0–36.0)
RBC: 3.72 MIL/uL — ABNORMAL LOW (ref 3.87–5.11)
WBC: 13.8 10*3/uL — ABNORMAL HIGH (ref 4.0–10.5)

## 2011-01-07 LAB — DIFFERENTIAL
Basophils Absolute: 0 10*3/uL (ref 0.0–0.1)
Basophils Relative: 0 % (ref 0–1)
Lymphocytes Relative: 7 % — ABNORMAL LOW (ref 12–46)
Neutro Abs: 11 10*3/uL — ABNORMAL HIGH (ref 1.7–7.7)
Neutrophils Relative %: 80 % — ABNORMAL HIGH (ref 43–77)

## 2011-01-07 LAB — COMPREHENSIVE METABOLIC PANEL
ALT: 14 U/L (ref 0–35)
Alkaline Phosphatase: 84 U/L (ref 39–117)
CO2: 33 mEq/L — ABNORMAL HIGH (ref 19–32)
Chloride: 110 mEq/L (ref 96–112)
GFR calc Af Amer: 41 mL/min — ABNORMAL LOW (ref >90)
Glucose, Bld: 177 mg/dL — ABNORMAL HIGH (ref 70–99)
Potassium: 3.6 mEq/L (ref 3.5–5.1)
Sodium: 152 mEq/L — ABNORMAL HIGH (ref 135–145)
Total Bilirubin: 0.4 mg/dL (ref 0.3–1.2)
Total Protein: 6.4 g/dL (ref 6.0–8.3)

## 2011-01-08 ENCOUNTER — Emergency Department (HOSPITAL_COMMUNITY): Payer: Medicare Other

## 2011-01-08 LAB — URINALYSIS, ROUTINE W REFLEX MICROSCOPIC
Bilirubin Urine: NEGATIVE
Hgb urine dipstick: NEGATIVE
Ketones, ur: NEGATIVE mg/dL
Specific Gravity, Urine: 1.015 (ref 1.005–1.030)
Urobilinogen, UA: 0.2 mg/dL (ref 0.0–1.0)

## 2011-01-08 LAB — BASIC METABOLIC PANEL
Calcium: 9.2 mg/dL (ref 8.4–10.5)
GFR calc non Af Amer: 36 mL/min — ABNORMAL LOW (ref >90)
Potassium: 3.7 mEq/L (ref 3.5–5.1)
Sodium: 149 mEq/L — ABNORMAL HIGH (ref 135–145)

## 2011-01-08 LAB — URINE MICROSCOPIC-ADD ON

## 2011-01-08 LAB — CK TOTAL AND CKMB (NOT AT ARMC)
CK, MB: 3.3 ng/mL (ref 0.3–4.0)
CK, MB: 3.3 ng/mL (ref 0.3–4.0)
Relative Index: INVALID (ref 0.0–2.5)
Total CK: 35 U/L (ref 7–177)
Total CK: 33 U/L (ref 7–177)

## 2011-01-08 LAB — CARDIAC PANEL(CRET KIN+CKTOT+MB+TROPI)
CK, MB: 5.4 ng/mL — ABNORMAL HIGH (ref 0.3–4.0)
Total CK: 40 U/L (ref 7–177)
Troponin I: 0.94 ng/mL (ref <0.30)

## 2011-01-08 LAB — PHOSPHORUS: Phosphorus: 3.5 mg/dL (ref 2.3–4.6)

## 2011-01-08 LAB — HEPARIN LEVEL (UNFRACTIONATED): Heparin Unfractionated: 0.12 IU/mL — ABNORMAL LOW (ref 0.30–0.70)

## 2011-01-08 MED ORDER — IOHEXOL 300 MG/ML  SOLN
80.0000 mL | Freq: Once | INTRAMUSCULAR | Status: AC | PRN
  Administered 2011-01-08: 80 mL via INTRAVENOUS

## 2011-01-09 ENCOUNTER — Inpatient Hospital Stay (HOSPITAL_COMMUNITY): Payer: Medicare Other

## 2011-01-09 LAB — BASIC METABOLIC PANEL
BUN: 32 mg/dL — ABNORMAL HIGH (ref 6–23)
CO2: 34 mEq/L — ABNORMAL HIGH (ref 19–32)
Calcium: 9.1 mg/dL (ref 8.4–10.5)
Chloride: 105 mEq/L (ref 96–112)
GFR calc Af Amer: 39 mL/min — ABNORMAL LOW (ref >90)
GFR calc non Af Amer: 34 mL/min — ABNORMAL LOW (ref >90)
GFR calc non Af Amer: 37 mL/min — ABNORMAL LOW (ref >90)
Glucose, Bld: 81 mg/dL (ref 70–99)
Glucose, Bld: 134 mg/dL — ABNORMAL HIGH (ref 70–99)
Potassium: 3.5 mEq/L (ref 3.5–5.1)
Potassium: 3.5 mEq/L (ref 3.5–5.1)
Sodium: 151 mEq/L — ABNORMAL HIGH (ref 135–145)
Sodium: 150 mEq/L — ABNORMAL HIGH (ref 135–145)

## 2011-01-09 LAB — DIFFERENTIAL
Basophils Absolute: 0 10*3/uL (ref 0.0–0.1)
Basophils Relative: 0 % (ref 0–1)
Eosinophils Absolute: 0.1 10*3/uL (ref 0.0–0.7)
Monocytes Absolute: 0.9 10*3/uL (ref 0.1–1.0)
Monocytes Relative: 13 % — ABNORMAL HIGH (ref 3–12)
Neutro Abs: 4.7 10*3/uL (ref 1.7–7.7)

## 2011-01-09 LAB — CARDIAC PANEL(CRET KIN+CKTOT+MB+TROPI)
Relative Index: INVALID (ref 0.0–2.5)
Total CK: 42 U/L (ref 7–177)

## 2011-01-09 LAB — GLUCOSE, CAPILLARY: Glucose-Capillary: 126 mg/dL — ABNORMAL HIGH (ref 70–99)

## 2011-01-09 LAB — PHOSPHORUS: Phosphorus: 3.7 mg/dL (ref 2.3–4.6)

## 2011-01-09 LAB — CBC
Hemoglobin: 10.6 g/dL — ABNORMAL LOW (ref 12.0–15.0)
MCH: 28.6 pg (ref 26.0–34.0)
MCHC: 29.7 g/dL — ABNORMAL LOW (ref 30.0–36.0)
Platelets: 207 10*3/uL (ref 150–400)
RDW: 17.9 % — ABNORMAL HIGH (ref 11.5–15.5)

## 2011-01-09 LAB — MAGNESIUM: Magnesium: 1.7 mg/dL (ref 1.5–2.5)

## 2011-01-09 LAB — HEPARIN LEVEL (UNFRACTIONATED): Heparin Unfractionated: <0.1 IU/mL — ABNORMAL LOW (ref 0.30–0.70)

## 2011-01-09 LAB — TSH: TSH: 1.698 u[IU]/mL (ref 0.350–4.500)

## 2011-01-10 ENCOUNTER — Inpatient Hospital Stay (HOSPITAL_COMMUNITY): Payer: Medicare Other

## 2011-01-10 LAB — DIFFERENTIAL
Basophils Absolute: 0 10*3/uL (ref 0.0–0.1)
Basophils Relative: 0 % (ref 0–1)
Eosinophils Absolute: 0.1 10*3/uL (ref 0.0–0.7)
Monocytes Absolute: 0.8 10*3/uL (ref 0.1–1.0)
Monocytes Relative: 14 % — ABNORMAL HIGH (ref 3–12)
Neutro Abs: 3.8 10*3/uL (ref 1.7–7.7)
Neutrophils Relative %: 63 % (ref 43–77)

## 2011-01-10 LAB — CBC
MCH: 28.4 pg (ref 26.0–34.0)
MCHC: 30.1 g/dL (ref 30.0–36.0)
Platelets: 195 10*3/uL (ref 150–400)
RBC: 3.24 MIL/uL — ABNORMAL LOW (ref 3.87–5.11)

## 2011-01-10 LAB — BASIC METABOLIC PANEL
BUN: 31 mg/dL — ABNORMAL HIGH (ref 6–23)
Creatinine, Ser: 1.16 mg/dL — ABNORMAL HIGH (ref 0.50–1.10)
GFR calc Af Amer: 46 mL/min — ABNORMAL LOW (ref >90)
GFR calc non Af Amer: 40 mL/min — ABNORMAL LOW (ref >90)

## 2011-01-10 LAB — PRO B NATRIURETIC PEPTIDE: Pro B Natriuretic peptide (BNP): 23609 pg/mL — ABNORMAL HIGH (ref 0–450)

## 2011-01-10 LAB — HEPARIN LEVEL (UNFRACTIONATED): Heparin Unfractionated: 0.28 IU/mL — ABNORMAL LOW (ref 0.30–0.70)

## 2011-01-10 LAB — URINE CULTURE

## 2011-01-10 LAB — GLUCOSE, CAPILLARY: Glucose-Capillary: 143 mg/dL — ABNORMAL HIGH (ref 70–99)

## 2011-01-11 LAB — PREALBUMIN: Prealbumin: 12 mg/dL — ABNORMAL LOW (ref 17.0–34.0)

## 2011-01-11 LAB — HEPARIN LEVEL (UNFRACTIONATED): Heparin Unfractionated: 0.11 IU/mL — ABNORMAL LOW (ref 0.30–0.70)

## 2011-01-11 LAB — GLUCOSE, CAPILLARY: Glucose-Capillary: 127 mg/dL — ABNORMAL HIGH (ref 70–99)

## 2011-01-11 LAB — BASIC METABOLIC PANEL
Calcium: 8.4 mg/dL (ref 8.4–10.5)
Creatinine, Ser: 1.11 mg/dL — ABNORMAL HIGH (ref 0.50–1.10)
GFR calc Af Amer: 48 mL/min — ABNORMAL LOW (ref >90)
GFR calc non Af Amer: 42 mL/min — ABNORMAL LOW (ref >90)

## 2011-01-11 LAB — CBC
MCH: 28.8 pg (ref 26.0–34.0)
MCHC: 30.5 g/dL (ref 30.0–36.0)
Platelets: 203 10*3/uL (ref 150–400)
RDW: 17.6 % — ABNORMAL HIGH (ref 11.5–15.5)

## 2011-01-11 NOTE — Progress Notes (Signed)
[name], [name] NO.:  [id number]  MEDICAL RECORD NO.:  [id number]  LOCATION:  2919                         FACILITY:  [facility]  PHYSICIAN:  Thad Ranger, MD       DATE OF BIRTH:  05-Jul-1916                                PROGRESS NOTE   ADMITTING PHYSICIAN: Carlota Raspberry, MD  PRIMARY CARE PHYSICIAN: Lenon Curt. Chilton Si, MD [facility].  CHIEF COMPLAINT/REASON FOR ADMISSION: [name] is a 77 year old female patient, who presented to the ER with shortness of breath and pulmonary edema, who was found to have a UTI. She had recently had some progressive hypoxemia and was treated with Lasix in the outpatient setting.  Upon arrival to the ER, she was found to have mild hypernatremia as well as an elevated proBNP.  Her chest x- ray was consistent with edema and CHF in the setting of mild acute renal failure and azotemia.  She was initially given a gentle IV fluid hydration with dextrose, but due to findings concerning for associated CHF, IV fluids were stopped in favor of an attempted gentle diuresis. She was initially admitted to the cardiac step-down unit.  Please refer to the history and physical exam for details about the admission including a baseline vital signs at presentation and clinical findings.  PAST MEDICAL HISTORY: 1. Vertebral column fractures T8, T9, and T11.  Ordered continuous     TLSO brace except when sleeping. 2. Hypothyroidism. 3. Senile osteoporosis. 4. Hypoxemia, previously thought to be due to poor respiratory     excursions during an admission in May 2012, with recent increase     respiratory symptoms, several weeks prior to this admission treated     with Lasix. 5. Hypertension. 6. Depression. 7. Possible migraine. 8. Dental pulp degeneration. 9. Callous corn. 10.Gait disorder. 11.Urinary incontinence. 12.History of prior urinary tract infection, E. coli isolated.  LABORATORY DATA: D-dimer at presentation 2.13.   Urinalysis was abnormal, consistent with a probable UTI.  Cardiac isoenzymes were cycled this admission.  Initial isoenzymes were normal, less than 0.30 with a peak Troponin I of 0.94 on January 08, 2011, that correlated with the onset of atrial fibrillation with rapid ventricular response.  ProBNP on January 08, 2011, is 40,981. Repeat proBNP on January 10, 2011, of 23,609.  MRSA PCR screening was positive.  Urine osmolality 404.  Serum osmolality 314.  TSH was 1.698. Urine culture greater than 100,000 colonies of Klebsiella pneumoniae, resistant to ampicillin; otherwise, pansensitive.  As of January 11, 2011, sodium 142, potassium 4.4, chloride 102, CO2 of 32, glucose 109, BUN 25, creatinine 1.11, and calcium 8.4.  White count 6200, hemoglobin 10.7, hematocrit 35.1, and platelets 203,000.  DIAGNOSTICS: 1. Chest x-ray at presentation, January 07, 2011, pneumonia views.     Findings are compatible with congestive heart failure with edema     and bilateral effusions, left greater than right. 2. CT angio of the chest, January 07, 2101, shows no evidence of     significant pulmonary embolus.  Bilateral pleural effusions with     bibasilar atelectasis or consolidation.  Multiple thoracic     vertebral compression fractures with increasing compression of  T11,     since study on Jul 17, 2010. 3. Portable chest x-ray, January 10, 2011, shows no significant     change. 4. A 2D echocardiogram from January 09, 2011.  Please note, the     patient has not had prior echo to compare with.  LV normal size,     normal wall thickness, moderate reduction in systolic function with     an estimated ejection fraction 35% to 40%.  Akinesis of the mid,     distal, and anterolateral septal and apical myocardium.  Doppler     parameters consistent with grade 1 diastolic dysfunction.     Calcified mitral anulus.  Trivial pericardial effusion.  Left     atrium was mildly to moderately dilated.  ADMITTING  DIAGNOSES: 1. Anterior ST elevation myocardial infarction.  Please note, EKG at     admission demonstrated frank ST-segment elevation in V1 through V3,     as well as Q-waves in V1 through V4 and smaller Q-waves in leads 2     and aVF. 2. Acute hypernatremia. 3. Leukocytosis and fever with urinary tract infection. 4. Congestive heart failure as evidenced on chest x-ray. 5. Acute renal insufficiency.  HOSPITAL COURSE: 1. Subacute non-STEMI.  The patient was found to have abnormal EKG     which the admitting doctor initially felt was related to a     STEMI process.  Cardiology was called to evaluate the patient.     They reviewed her history and her admission EKG and did not feel the     patient was having a STEMI and felt she actually had a subacute non-     STEMI.  She was placed empirically on IV heparin then started on     aspirin and Plavix.  Cardiology has determined she is appropriate     for medical treatment only and not a cardiac catheterization     candidate secondary to age and advanced chronic kidney disease.     She has also been started on a beta-blocker at this admission. 2. Acute hypoxemic respiratory failure secondary to ischemic CHF and     possible underlying community-acquired versus aspiration pneumonia.     The patient's symptomatology has improved with treating suspected     underlying causes.  We are treating the ischemia with medications     and continue supportive care with oxygen.  She had been empirically     placed on Rocephin due to concerns over UTI at admission and     Zithromax was added on January 09, 2011, to cover for atypical     pneumonia process.  She was initially given Lasix for suspected CHF     that was seen on chest x-ray, but this was discontinued on January 09, 2011, due to the patient being intravascularly dry, as     evidenced by hypernatremia and elevated serum osmolality.  The 2D     echo did show moderate systolic dysfunction with  apical regional     wall motion abnormalities, likely due to MI prior to admission.     Her chest x-ray has remained stable.  She had no evidence of PE on     CT angio of the chest. 3. Ischemic cardiomyopathy with moderate systolic dysfunction.  EF 35%     to 40% and grade I diastolic dysfunction.  Chest x-ray still shows     evidence of edema, likely onset preadmission due to MI of  uncertain     precipitating factors.  She is difficult to diurese with Lasix     acutely due to post myocardial infarction failure to thrive, and she     presented with dehydration and infectious processes.  Cardiology     suspects that we can achieve auto diuresis with the addition of     afterload reduction agents.  She has been started on hydralazine     this admission and lisinopril was added on January 11, 2011.     Cardiology suspects she will need to be on very low-dose Lasix after     discharge. 4. New onset atrial fibrillation with rapid ventricular response.  The     patient is currently maintaining sinus rhythm now and she is a     marginal Coumadin candidate at this point.  Atrial fibrillation     with RVR occurred after attempts were made to diurese the patient,     and she had subsequent volume loss related tachyarrhythmia.  The     arrhythmias resolved after restoration of volume.  As noted, she is     already on p.o. beta blockers with Lopressor.  We are trying to     keep the potassium greater than 4.  She did have a slight bump in     her troponin I.  Cards feels this is likely due to recent rapid     ventricular response.  Cardiology also documents that she will need     ambulatory telemetry after discharge to determine if she is a long-     term anticoagulation candidate. 5. Acute renal failure on chronic kidney disease stage 4.  The     patient's baseline creatinine is 1.2 with a creatinine clearance of     28.  Her BUN was up slightly at presentation and has decreased     somewhat with  correction of dehydration process.  Renal function is     improving with rehydration. 6. Acute Klebsiella UTI.  Culture is pansensitive except for     resistance to ampicillin.  She remains on Rocephin, today is day     #5. 7. Acute hypernatremia with dehydration.  The patient presented with     hypernatremia, likely due to poor p.o. intake post events either     related to the MI or precipitating event such as associated urinary     infection and possible pneumonia process.  Etiology will probably     never be clearly delineated.  She did have an elevated serum     osmolality and was noted to have clinical improvement in her     symptomatology after rehydration.  IV fluids currently are to keep     open, but she still has poor p.o. intake.  She may also have a     degree of volume shifting into the lungs and third spacing due to     hypoalbuminemic state, a pre-albumin is pending. 8. Hypertension.  Please note, the patient did not carry this     diagnosis prior to admission and was only on Lasix prior to     admission due to recent issues with shortness of breath.     Currently, blood pressure is moderately controlled.  Lopressor was     added January 09, 2011, and ACE inhibitor will be added on January 10, 2010.  Please note that initially Imdur was added on January 10, 2011, as  an afterload reducing agent as well as a nitrate to use    for suspected underlying CAD due to presentation of subacute non-     STEMI.  This medication was verbally discontinued on January 10, 2011, but I see no documentation in the chart as to the reason why. 9. Mild primary oropharyngeal dysphagia.  Modified barium swallow     study was completed on January 09, 2011.  The patient is now on a     D3 diet and tolerating well. 10.Hypothyroidism.  This is a chronic problem.  She is on Synthroid.     Her TSH is normal on this admission. 11.History of thoracic compression fractures.  CT on this  admission     shows increasing volume loss and compression at the T11 level.  No     apparent symptomatology reported by the patient.  She is supposed     to be on a TLSO brace continuously except during sleep. 12.Protein-calorie malnutrition.  As previously noted, pre-albumin is     pending.  P.o. intake is poor.  Recommend if pre-albumin is     significantly low, nutrition consultation on this admission.  DISPOSITION: At the present time, this patient is appropriate to transfer to the telemetry unit.  I have ordered PT and OT evaluation today.  She was residing at [facility] prior to admission and she is a DNR.     Allison L. Rennis Harding, N.P.   ______________________________ Thad Ranger, MD    ALE/MEDQ  D:  01/11/2011  T:  01/11/2011  Job:  [id number]  cc:   Lenon Curt Chilton Si, M.D. Fax: [phone]  Electronically Signed by Junious Silk N.P. on 01/11/2011 03:42:24 PM Electronically Signed by Andres Labrum Serigne Kubicek  on 01/11/2011 08:24:22 PM

## 2011-01-12 LAB — HEPARIN LEVEL (UNFRACTIONATED): Heparin Unfractionated: 0.1 IU/mL — ABNORMAL LOW (ref 0.30–0.70)

## 2011-01-12 LAB — BASIC METABOLIC PANEL
CO2: 31 mEq/L (ref 19–32)
Chloride: 103 mEq/L (ref 96–112)
Sodium: 143 mEq/L (ref 135–145)

## 2011-01-12 LAB — GLUCOSE, CAPILLARY
Glucose-Capillary: 94 mg/dL (ref 70–99)
Glucose-Capillary: 105 mg/dL — ABNORMAL HIGH (ref 70–99)
Glucose-Capillary: 133 mg/dL — ABNORMAL HIGH (ref 70–99)
Glucose-Capillary: 124 mg/dL — ABNORMAL HIGH (ref 70–99)

## 2011-01-12 LAB — CBC
Hemoglobin: 11 g/dL — ABNORMAL LOW (ref 12.0–15.0)
MCH: 28.5 pg (ref 26.0–34.0)
RBC: 3.86 MIL/uL — ABNORMAL LOW (ref 3.87–5.11)

## 2011-01-13 LAB — PRO B NATRIURETIC PEPTIDE: Pro B Natriuretic peptide (BNP): 18768 pg/mL — ABNORMAL HIGH (ref 0–450)

## 2011-01-13 LAB — BASIC METABOLIC PANEL
Calcium: 8.7 mg/dL (ref 8.4–10.5)
Creatinine, Ser: 1.06 mg/dL (ref 0.50–1.10)
GFR calc Af Amer: 51 mL/min — ABNORMAL LOW (ref >90)

## 2011-01-13 LAB — GLUCOSE, CAPILLARY
Glucose-Capillary: 84 mg/dL (ref 70–99)
Glucose-Capillary: 106 mg/dL — ABNORMAL HIGH (ref 70–99)
Glucose-Capillary: 111 mg/dL — ABNORMAL HIGH (ref 70–99)

## 2011-01-13 MED ORDER — LISINOPRIL 5 MG PO TABS
5.0000 mg | ORAL_TABLET | Freq: Every day | ORAL | Status: DC
  Administered 2011-01-14 – 2011-01-16 (×3): 5 mg via ORAL
  Filled 2011-01-13 (×4): qty 1

## 2011-01-13 MED ORDER — LEVOTHYROXINE SODIUM 125 MCG PO TABS
125.0000 ug | ORAL_TABLET | Freq: Every day | ORAL | Status: DC
  Administered 2011-01-14 – 2011-01-16 (×3): 125 ug via ORAL
  Filled 2011-01-13 (×4): qty 1

## 2011-01-13 MED ORDER — FUROSEMIDE 20 MG PO TABS
20.0000 mg | ORAL_TABLET | Freq: Every day | ORAL | Status: DC
  Administered 2011-01-14 – 2011-01-16 (×3): 20 mg via ORAL
  Filled 2011-01-13 (×4): qty 1

## 2011-01-13 MED ORDER — ENOXAPARIN SODIUM 30 MG/0.3ML ~~LOC~~ SOLN
30.0000 mg | Freq: Every day | SUBCUTANEOUS | Status: DC
  Administered 2011-01-14 – 2011-01-15 (×2): 30 mg via SUBCUTANEOUS
  Filled 2011-01-13 (×4): qty 0.3

## 2011-01-13 MED ORDER — SODIUM CHLORIDE 0.45 % IV SOLN
INTRAVENOUS | Status: DC
  Administered 2011-01-14: 23:00:00 via INTRAVENOUS
  Administered 2011-01-15: 20 mL/h via INTRAVENOUS

## 2011-01-13 MED ORDER — DEXTROSE 5 % IV SOLN
500.0000 mg | INTRAVENOUS | Status: DC
  Filled 2011-01-13: qty 500

## 2011-01-13 MED ORDER — DEXTROSE 5 % IV SOLN
1.0000 g | INTRAVENOUS | Status: DC
  Administered 2011-01-14: 1 g via INTRAVENOUS
  Filled 2011-01-13 (×3): qty 10

## 2011-01-13 MED ORDER — ENSURE CLINICAL ST REVIGOR PO LIQD
237.0000 mL | Freq: Three times a day (TID) | ORAL | Status: DC
  Administered 2011-01-14 – 2011-01-16 (×5): 237 mL via ORAL

## 2011-01-13 MED ORDER — CLOPIDOGREL BISULFATE 75 MG PO TABS
75.0000 mg | ORAL_TABLET | Freq: Every day | ORAL | Status: DC
  Administered 2011-01-14 – 2011-01-16 (×3): 75 mg via ORAL
  Filled 2011-01-13 (×2): qty 1

## 2011-01-13 MED ORDER — ONDANSETRON HCL 4 MG PO TABS: 4.0000 mg | ORAL_TABLET | Freq: Four times a day (QID) | ORAL | Status: DC | PRN

## 2011-01-13 MED ORDER — METOPROLOL TARTRATE 50 MG PO TABS
50.0000 mg | ORAL_TABLET | Freq: Two times a day (BID) | ORAL | Status: DC
  Administered 2011-01-14 – 2011-01-16 (×5): 50 mg via ORAL
  Filled 2011-01-13 (×8): qty 1

## 2011-01-13 MED ORDER — ENSURE PUDDING PO PUDG
1.0000 | Freq: Three times a day (TID) | ORAL | Status: DC
  Administered 2011-01-14 – 2011-01-16 (×4): 1 via ORAL

## 2011-01-13 MED ORDER — ACETAMINOPHEN 325 MG PO TABS
650.0000 mg | ORAL_TABLET | ORAL | Status: DC | PRN
  Administered 2011-01-15: 650 mg via ORAL
  Filled 2011-01-13: qty 2

## 2011-01-13 MED ORDER — HYDRALAZINE HCL 25 MG PO TABS
25.0000 mg | ORAL_TABLET | Freq: Three times a day (TID) | ORAL | Status: DC
  Administered 2011-01-14 – 2011-01-16 (×7): 25 mg via ORAL
  Filled 2011-01-13 (×12): qty 1

## 2011-01-13 MED ORDER — ASPIRIN EC 325 MG PO TBEC
325.0000 mg | DELAYED_RELEASE_TABLET | Freq: Every day | ORAL | Status: DC
  Administered 2011-01-14 – 2011-01-16 (×3): 325 mg via ORAL
  Filled 2011-01-13 (×3): qty 1

## 2011-01-13 MED ORDER — ONDANSETRON HCL 4 MG/2ML IJ SOLN: 4.0000 mg | Freq: Four times a day (QID) | INTRAMUSCULAR | Status: DC | PRN

## 2011-01-13 NOTE — H&P (Signed)
NAMEMADYSYN, HANKEN                 ACCOUNT NO.:  [id number]  MEDICAL RECORD NO.:  [id number]  LOCATION:  [facility]                         FACILITY:  [facility]  PHYSICIAN:  Carlota Raspberry, MD         DATE OF BIRTH:  10-06-1916  DATE OF ADMISSION:  01/07/2011 DATE OF DISCHARGE:                             HISTORY & PHYSICAL   PRIMARY CARE PHYSICIAN:  Lenon Curt. Chilton Si, MD with [facility]  This is per prior E-each chart as the patient is unable to give any history at this point.  CHIEF COMPLAINT:  Hypoxia on room air, shortness of breath, pulmonary edema on chest x-ray at nursing home.  HISTORY OF PRESENT ILLNESS:  A 77 year old female with a history of vertebral column fractures; hypothyroidism; previous UTI; previous hypoxemia, previously thought due to poor respiratory excursions presents with shortness of breath and pulmonary edema and found to have a UTI.  The patient is completely unable to give any history at this point; however, per discussion with the ED staff who spoke with the patient's son who was here just before I went to evaluate the patient, it appears that the patient has been living at [facility] in [place] where per some of the documentation they have provided it appears that they have gotten a chest x-ray in the past week, which showed worsening pleural effusions and findings consistent with CHF.  They had up titrated her to 40 mg of p.o. Lasix; however, it appears that earlier today she was much more short of breath and was placed on O2 by the nursing home.  She was brought to emergency room for further evaluation where her initial vital signs were 124/53 with a pulse of 102, respiratory rate of 34, and unclear oxygenation level, but per report was hypoxic on room air.  She appears to have spiked a temperature max of 100.3 while she was here in the emergency room.  Her workup in the ED showed an elevated D-dimer, so they got a CTA, which did not show a  PE, but showed bilateral effusions and atelectasis.  This was after a chest x-ray, which was concerning for pulmonary edema and bilateral effusions. She also showed evidence of UTI for which she got 1 g of ceftriaxone in the ED.  She also is hypernatremic at 152 and her bicarb is a bit higher at 33 and her renal function is just a bit elevated as well.  She is being admitted for hypoxia and UTI.  As above, the patient at present is unable to give any history and it seems that her son has just left.  PAST MEDICAL HISTORY: 1. Vertebral column fractures at T8, severe T9 fracture, fracture at     T11, and severe burst fracture at L1.  She is supposed to be     wearing a TLSO brace at all times, except when sleeping. 2. Hypothyroidism. 3. Senile osteoporosis. 4. Hypoxemia, previously thought due to poor respiratory excursions     during an admission in May 2012. 5. Hypertension. 6. Depression. 7. Possible migraine. 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 10.Gait disorder. 11.Urinary incontinence. 12.History of UTI with E.  coli.  PAST SURGICAL HISTORY: 1. Skin cancer in 1993 and 1990. 2. Bilateral cataract extractions and lens implants. 3. Total right hip replacement in 2002. 4. Left proximal radial fracture, status post ORIF in 2008.  MEDICATIONS:  Medication list was reconciled by the pharmacist and includes: 1. Aspirin 81 daily. 2. Prilosec 20 mg daily. 3. Synthroid 125 mcg daily. 4. Lasix 40 mg daily. 5. Promethazine 12.5, 1 injection daily p.r.n. nausea. 6. Tylenol 500 two tablets q.6 h. p.r.n. pain. 7. Bisacodyl 5 mg 1-2 tablets daily p.r.n. constipation. 8. MiraLax 17 g daily.  ALLERGIES:  Listed are to: 1. NAPROXEN. 2. HYDROCODONE/APAP.  SOCIAL HISTORY:  Unable to be obtained, but was reconciled with the previous E-chart.  She is currently living at [facility] and I believe has been so since her last discharge in May 2012.  She gets around with a  walker and was previously living with her husband and was previously fairly independent.  I do not know what her level of independence is at this point.  Husband is a next of kin.  His name is Roxana Lai and his phone number is [phone].  Son's name is Harvie Heck and his number is [phone].  FAMILY HISTORY:  Also unobtainable, but per E-chart was significant for CAD in her uncle and hypertension and an MI in her uncle at the age of 46.  Most relatives have lived well into their old age though.  PHYSICAL EXAMINATION:  VITAL SIGNS:  Blood pressure is 116/52, pulse is 94 and has ranged in the low 90s-100s on the ED monitor.  Respiratory rate is 20.  Oxygen is 98% on 15 L non-rebreather mask. GENERAL:  She is an elderly-appearing, thin female in the ED stretcher. She does not open her eyes to verbal stimulation.  She only opens her eyes to really quite vigorous tactile stimulation and when we sit her forward to listen to her lungs at which point she does open her eyes and look around and then dozes off again.  She does not answer any questions or follow any commands. HEENT:  Her pupils are equal, round, and reactive to light.  Extraocular muscles are difficult to ascertain.  Her mouth was unable to be examined. NECK:  Supple.  I did notice some distended external jugular veins on the right, but it was difficult to evaluate where her pulsations were noted.  She did have a Kussmaul sign in that she had distention of her external jugulars when she inspired. LUNGS:  Surprisingly clear to auscultation bilaterally with only light sounding rales.  There was actually not a lot of decreased breath sounds as I would have expected with the effusion. HEART:  Borderline tachycardic with what sounded like an S4 at her right upper sternal border that was not heard throughout the rest of the precordium.  There was also a light systolic murmur at that same location.  Other parts of her precordium were just in  S1 and S2. ABDOMEN:  Soft, nontender, nondistended, non peritoneal. EXTREMITIES:  Warm and well perfused with no cyanosis or clubbing. There was no coolness of her hands or feet.  There was no lower extremity edema to speak of.  Her bilateral radial pulses were not very easily palpable. NEURO:  Difficult to ascertain as she was quite unarousable.  LABORATORY WORK:  White blood cell count is 13.8 with 80% neutrophils, 7% lymphocytes, 14% monocytes, otherwise normal.  Hematocrit is 35.7 with an MCV of 96 which were both within  baseline, platelets 212.  D- dimer 2.13.  Sodium 152, potassium 3.6, chloride 110, bicarb 33, renal function is 33 and 1.28 which is above her prior baseline of 1.0 to 1.1, glucose is 177.  LFTs are normal.  Calcium 9.5, albumin 3.1.  UA shows positive nitrites, small leuk esterase, 7-10 wbc's, many bacteria. Urine culture is pending.  Chest x-ray shows findings compatible with congestive heart failure with edema and bilateral pleural effusions, left greater than right.  CTA chest shows no evidence of significant pulmonary embolus.  There are bilateral pleural effusions with bibasilar atelectasis or consolidation. There are multiple thoracic vertebral compression fractures with increasing compression of T11 since May 2012.  EKG done today shows normal sinus rhythm with a normal axis.  P-waves are grossly normal except LAE inV1.  QRSs are narrow at 84 msec.  There are what appears to be Q-waves in 3 and smaller ones in 2 and aVF; however, there are very frank and concerning Q-waves in V1-V4 that her brand new since the last one of May 2012.  There was also frank ST elevation from V1-V3 with tombstoning of those segments.  This is brand new in comparison to May 2012.  There are T-wave inversions in the lateral leads.  The Q-waves in the inferior leads are likely old from Jul 30 2010.  IMPRESSION:  This is a 76 year old lady who has a history of  vertebral compression fractures, hypothyroidism who presents from a nursing home with chest x-ray concerning for pulmonary edema.  Here in the emergency department, she is found to have bilateral pulmonary edema and pleural effusions, hypernatremia, leukocytosis with urinary tract infection, and most recently found to have an anterior ST-elevation myocardial infarction. 1. Anterior ST-elevation myocardial infarction.  We will get cardiac     enzymes stat, and I will call cardiology stat.  We will have to     talk with her family about how aggressive her care will be when     whether they would want to take her to catheterization versus acute     medical management with heparinization and other therapies for     STEMI. 2. Hypernatremia.  This is likely due to poor free water intake;     however, she is also showing evidence of volume overload and     therefore we will give her D5W at 75 mL/h and 20 mg of IV Lasix     boluses at the same time and trend her BMET q.6 h. 3. Leukocytosis, fevers, and urinary tract infection.  This was     previously treated in May 2012 with ceftriaxone, transitioned to     Bactrim for E. coli.  We will treat with ceftriaxone for now,     followup her urine culture. 4. Volume overload.  We will diurese as above, place a Foley, and will     speak with Cardiology. 5. Renal insufficiency.  Her creatinine is at 1.28, which is just     above her prior baseline of 1.0 to 1.1.  This is likely due to poor     forward flow in the setting of CHF and STEMI. 6. Fluids, electrolytes, and nutrition.  We will give her fluids as     above and keep her n.p.o. for now. 7. IV access.  She has 1 peripheral IV. 8. Prophylaxis.  Subcutaneous heparin for now, but we may need to     systemically anticoagulate her, Zofran, we     will give her  aspirin 325. 9. Code status.  Unable to be discussed; however, per nursing home     documentation, she was to be a full code. 10.The  patient will be admitted to the step-down unit under [facility]     6.          ______________________________ Carlota Raspberry, MD     EB/MEDQ  D:  01/08/2011  T:  01/08/2011  Job:  [id number]  Electronically Signed by Carlota Raspberry MD on 01/13/2011 04:19:29 AM

## 2011-01-14 DIAGNOSIS — I480 Paroxysmal atrial fibrillation: Secondary | ICD-10-CM | POA: Diagnosis present

## 2011-01-14 DIAGNOSIS — J9690 Respiratory failure, unspecified, unspecified whether with hypoxia or hypercapnia: Secondary | ICD-10-CM | POA: Diagnosis not present

## 2011-01-14 DIAGNOSIS — I214 Non-ST elevation (NSTEMI) myocardial infarction: Secondary | ICD-10-CM | POA: Diagnosis present

## 2011-01-14 DIAGNOSIS — A498 Other bacterial infections of unspecified site: Secondary | ICD-10-CM | POA: Diagnosis present

## 2011-01-14 DIAGNOSIS — N390 Urinary tract infection, site not specified: Secondary | ICD-10-CM | POA: Diagnosis present

## 2011-01-14 DIAGNOSIS — I5021 Acute systolic (congestive) heart failure: Secondary | ICD-10-CM | POA: Diagnosis present

## 2011-01-14 DIAGNOSIS — I429 Cardiomyopathy, unspecified: Secondary | ICD-10-CM | POA: Diagnosis present

## 2011-01-14 DIAGNOSIS — E440 Moderate protein-calorie malnutrition: Secondary | ICD-10-CM | POA: Diagnosis present

## 2011-01-14 LAB — URINALYSIS, ROUTINE W REFLEX MICROSCOPIC
Bilirubin Urine: NEGATIVE
Ketones, ur: NEGATIVE mg/dL
Nitrite: NEGATIVE
Urobilinogen, UA: 0.2 mg/dL (ref 0.0–1.0)

## 2011-01-14 LAB — BASIC METABOLIC PANEL
BUN: 19 mg/dL (ref 6–23)
Creatinine, Ser: 1.05 mg/dL (ref 0.50–1.10)
GFR calc Af Amer: 52 mL/min — ABNORMAL LOW (ref >90)
GFR calc non Af Amer: 45 mL/min — ABNORMAL LOW (ref >90)
Potassium: 4.2 mEq/L (ref 3.5–5.1)

## 2011-01-14 LAB — URINE MICROSCOPIC-ADD ON

## 2011-01-14 LAB — PRO B NATRIURETIC PEPTIDE: Pro B Natriuretic peptide (BNP): 21783 pg/mL — ABNORMAL HIGH (ref 0–450)

## 2011-01-14 LAB — GLUCOSE, CAPILLARY
Glucose-Capillary: 77 mg/dL (ref 70–99)
Glucose-Capillary: 95 mg/dL (ref 70–99)

## 2011-01-14 MED ORDER — INSULIN ASPART 100 UNIT/ML ~~LOC~~ SOLN: 0.0000 [IU] | Freq: Every day | SUBCUTANEOUS | Status: DC

## 2011-01-14 MED ORDER — INSULIN ASPART 100 UNIT/ML ~~LOC~~ SOLN
0.0000 [IU] | Freq: Three times a day (TID) | SUBCUTANEOUS | Status: DC
  Filled 2011-01-14: qty 3

## 2011-01-14 NOTE — Progress Notes (Signed)
Subjective: Patient continues to complain of pain in her back which is chronic problem. The patient does have some dementia and is unable to give reliable information as to her condition. I spoke with patient's son who wants to be conservative in the care of his mother regarding her cardiac condition. I was to cardiology on tomorrow regarding her the plan to progress with the management of her congestive heart failure and her non-ST elevated MI. Objective: Filed Vitals:   01/14/11 0500 01/14/11 1106 01/14/11 1405 01/14/11 1430  BP: 173/99 166/88 108/64 121/72  Pulse: 86 87 66   Temp: 97.2 F (36.2 C)  97 F (36.1 C)   TempSrc: Oral  Oral   Resp: 18  18   Height:      Weight:      SpO2: 95%  95%    Weight change:   Intake/Output Summary (Last 24 hours) at 01/14/11 1931 Last data filed at 01/14/11 1700  Gross per 24 hour  Intake    540 ml  Output      0 ml  Net    540 ml    General: Alert, awake, oriented to person and in no acute distress.  HEENT: [place]/AT PEERL, EOMI Neck: Trachea midline,  no masses, no thyromegal,y no JVD, no carotid bruit OROPHARYNX:  Moist, No exudate/ erythema/lesions.  Heart: Regular rate and rhythm, without murmurs, rubs, gallops, PMI non-displaced, no heaves or thrills on palpation.  Lungs: Clear to auscultation, no wheezing or rhonchi noted. No increased vocal fremitus resonant to percussion  Abdomen: Soft, nontender, nondistended, positive bowel sounds, no masses no hepatosplenomegaly noted..  Neuro: No focal neurological deficits noted cranial nerves II through XII grossly intact. DTRs 2+ bilaterally upper and lower extremities. Strength 5 out of 5 in bilateral upper and lower extremities. Musculoskeletal: No warm swelling or erythema around joints, no spinal tenderness noted. Psychiatric: Patient alert and oriented x3, good insight and cognition, good recent to remote recall. Lymph node survey: No cervical axillary or inguinal lymphadenopathy  noted.     Lab Results:  Basename 01/14/11 0940 01/13/11 0500  NA 141 141  K 4.2 4.1  CL 100 103  CO2 32 28  GLUCOSE 128* 94  BUN 19 21  CREATININE 1.05 1.06  CALCIUM 9.1 8.7  MG -- --  PHOS -- --   No results found for this basename: AST:2,ALT:2,ALKPHOS:2,BILITOT:2,PROT:2,ALBUMIN:2 in the last 72 hours No results found for this basename: LIPASE:2,AMYLASE:2 in the last 72 hours  Basename 01/12/11 0548  WBC 7.6  NEUTROABS --  HGB 11.0*  HCT 36.0  MCV 93.3  PLT 208   No results found for this basename: CKTOTAL:3,CKMB:3,CKMBINDEX:3,TROPONINI:3 in the last 72 hours  Basename 01/14/11 0500 01/13/11 0500  POCBNP 21783.0* 18768.0*   No results found for this basename: DDIMER:2 in the last 72 hours No results found for this basename: HGBA1C:2 in the last 72 hours No results found for this basename: CHOL:2,HDL:2,LDLCALC:2,TRIG:2,CHOLHDL:2,LDLDIRECT:2 in the last 72 hours No results found for this basename: TSH,T4TOTAL,FREET3,T3FREE,THYROIDAB in the last 72 hours No results found for this basename: VITAMINB12:2,FOLATE:2,FERRITIN:2,TIBC:2,IRON:2,RETICCTPCT:2 in the last 72 hours  Micro Results: Recent Results (from the past 240 hour(s))  URINE CULTURE     Status: Normal   Collection Time   01/07/11 10:47 PM      Component Value Range Status Comment   Specimen Description URINE, CATHETERIZED   Final    Special Requests NONE   Final    Setup Time [id number]   Final  Colony Count >=100,000 COLONIES/ML   Final    Culture     Final    Value: KLEBSIELLA PNEUMONIAE     Note: Two isolates with different morphologies were identified as the same organism.The most resistant organism was reported.   Report Status 01/10/2011 FINAL   Final    Organism ID, Bacteria KLEBSIELLA PNEUMONIAE   Final   MRSA PCR SCREENING     Status: Abnormal   Collection Time   01/08/11  9:09 PM      Component Value Range Status Comment   MRSA by PCR POSITIVE (*) NEGATIVE  Final      Studies/Results: Ct Angio Chest W/cm &/or Wo Cm  01/08/2011  *RADIOLOGY REPORT*  Clinical Data:  Shortness of breath.  Weakness.  Low oxygen saturation.  CT ANGIOGRAPHY CHEST WITH CONTRAST  Technique:  Multidetector CT imaging of the chest was performed using the standard protocol during bolus administration of intravenous contrast.  Multiplanar CT image reconstructions including MIPs were obtained to evaluate the vascular anatomy.  Contrast: 80mL OMNIPAQUE IOHEXOL 300 MG/ML IV SOLN  Comparison:  07/27/2010  Findings:  Technically adequate study with good opacification of the central and segmental pulmonary arteries.  No focal filling defects.  No evidence of significant pulmonary embolus.  Normal caliber thoracic aorta without dissection.  Aortic calcification. Calcification of coronary arteries.  Moderate sized bilateral pleural effusions with basilar atelectasis or consolidation bilaterally.  No pneumothorax.  No significant lymphadenopathy in the chest.  Degenerative changes in the thoracic spine.  Multiple thoracic vertebral compression deformities with stable compression of T9, increased compression of T11, and compression of L1, not visualized on the previous study.  Review of the MIP images confirms the above findings.  IMPRESSION: No evidence of significant pulmonary embolus.  Bilateral pleural effusions with basilar atelectasis or consolidation.  Multiple thoracic vertebral compression fractures with increasing compression of T11 since 07/17/2010.  Original Report Authenticated By: Marlon Pel, M.D.   Dg Chest Portable 1 View  01/10/2011  *RADIOLOGY REPORT*  Clinical Data: Chest pain.  PORTABLE CHEST - 1 VIEW  Comparison: 01/07/2011  Findings: Cardiomegaly.  Bilateral airspace disease and effusions, unchanged.  No acute bony abnormality.  IMPRESSION: No significant change.  Original Report Authenticated By: Cyndie Chime, M.D.   Dg Pneumonia Chest Port1v  01/07/2011  *RADIOLOGY  REPORT*  Clinical Data: Short of breath  PORTABLE CHEST - 1 VIEW  Comparison: 07/27/2010  Findings: Diffuse bilateral airspace disease.  Pulmonary vascular congestion.  Bilateral pleural effusions and bibasilar atelectasis, left greater than right.  IMPRESSION: Findings are compatible with congestive heart failure with edema and bilateral effusions, left greater than right.  Original Report Authenticated By: Camelia Phenes, M.D.   Dg Swallowing Func-no Report  01/09/2011  CLINICAL DATA: dysphagia   FLUOROSCOPY FOR SWALLOWING FUNCTION STUDY:  Fluoroscopy was provided for swallowing function study, which was  administered by a speech pathologist.  Final results and recommendations  from this study are contained within the speech pathology report.      Medications: I have reviewed the patient's current medications. Scheduled Meds:   . aspirin EC  325 mg Oral Daily  . cefTRIAXone (ROCEPHIN) IVPB 1 gram/50 mL D5W  1 g Intravenous Q24H  . clopidogrel  75 mg Oral Q breakfast  . enoxaparin (LOVENOX) injection  30 mg Subcutaneous QHS  . feeding supplement  237 mL Oral TID  . feeding supplement  1 Container Oral TID BM  . furosemide  20 mg Oral Daily  .  hydrALAZINE  25 mg Oral Q8H  . insulin aspart  0-15 Units Subcutaneous TID WC  . insulin aspart  0-5 Units Subcutaneous QHS  . levothyroxine  125 mcg Oral QAC breakfast  . lisinopril  5 mg Oral Daily  . metoprolol tartrate  50 mg Oral BID  . DISCONTD: azithromycin  500 mg Intravenous Q24H   Continuous Infusions:   . sodium chloride     PRN Meds:.acetaminophen, ondansetron, ondansetron Assessment/Plan: Patient Active Hospital Problem List: NSTEMI (non-ST elevation myocardial infarction) (01/14/2011)   Assessment: Pt has no complaints of chest pain at this time. She does have a result since systolic dysfunction. Presently she is being diuresed. There was some discussion apparently was seen in cardiology and the family about pursuing the cardiac  catheter and this patient for stress test. The patient's son his essentially decided against pursuing any further testing. I will have a discussion with cardiology in the morning regarding this.    Plan:  Acute systolic heart failure (01/14/2011)   Assessment: See above    Plan:  Cardiomyopathy (01/14/2011)   Assessment: See above.    Plan:  PAF (paroxysmal atrial fibrillation) (01/14/2011)   Assessment: Patient has some paroxysmal atrial fibrillation however heart rate is controlled. Patient is not a candidate for Coumadin    Plan:  Respiratory failure (01/14/2011)   Assessment: Respiratory failure resolved.    Plan:  Klebsiella infect (01/14/2011)   Assessment: The patient is pleasant presently on antibiotics for her urinary tract infection.    Plan:  UTI (urinary tract infection) (01/14/2011)   Assessment: See above    Plan:  Malnutrition of moderate degree (01/14/2011)   Assessment: Nutrition has seen the placed the patient. Supplemental protein drink have been offered to the patient however her oral intake is still poor.    Plan:    LOS: 7 days

## 2011-01-14 NOTE — Progress Notes (Signed)
Speech Language/Pathology     Speech Language Pathology Treatment: Dysphagia Tx  Patient Details Name: [name] MRN: [id number] DOB: 11/20/1916 Today's Date: 01/14/2011  Assessment of Diet Tolerance Clinical Impression Statement: Patient not observed with pos secondary to just finishing breakfast. Patient with some belching followed by multiple swallows stating "some of my food is coming back up". Likely secondary to known esophageal deficits. No overt s/s of aspiration observed. Patient did present with one episode of c/o SOB with O2 sats dropping to 89-90%. RN made aware. O2 increased and sats increased as well. Patient able to verbalize compensatory strategies with min questioning cues. Son present and able to verbalize understanding of diet recommendations and compensatory strategies independently.  Type of cueing: Verbal Amount of cueing: Minimal Recommendations: Continue current diet   Myrtis Hopping Meryl 01/14/2011, 9:46 AM

## 2011-01-15 ENCOUNTER — Ambulatory Visit: Payer: Medicare Other | Admitting: Gastroenterology

## 2011-01-15 LAB — BASIC METABOLIC PANEL
Calcium: 9.3 mg/dL (ref 8.4–10.5)
GFR calc Af Amer: 54 mL/min — ABNORMAL LOW (ref >90)
GFR calc non Af Amer: 46 mL/min — ABNORMAL LOW (ref >90)
Potassium: 3.9 mEq/L (ref 3.5–5.1)
Sodium: 138 mEq/L (ref 135–145)

## 2011-01-15 LAB — CBC
Hemoglobin: 10.1 g/dL — ABNORMAL LOW (ref 12.0–15.0)
MCH: 28.5 pg (ref 26.0–34.0)
Platelets: 242 10*3/uL (ref 150–400)
RBC: 3.54 MIL/uL — ABNORMAL LOW (ref 3.87–5.11)
WBC: 7.8 10*3/uL (ref 4.0–10.5)

## 2011-01-15 LAB — DIFFERENTIAL
Eosinophils Absolute: 0.1 10*3/uL (ref 0.0–0.7)
Lymphocytes Relative: 14 % (ref 12–46)
Lymphs Abs: 1.1 10*3/uL (ref 0.7–4.0)
Monocytes Relative: 13 % — ABNORMAL HIGH (ref 3–12)
Neutro Abs: 5.6 10*3/uL (ref 1.7–7.7)
Neutrophils Relative %: 72 % (ref 43–77)

## 2011-01-15 LAB — GLUCOSE, CAPILLARY: Glucose-Capillary: 110 mg/dL — ABNORMAL HIGH (ref 70–99)

## 2011-01-15 MED ORDER — DEXTROSE 5 % IV SOLN
1.0000 g | INTRAVENOUS | Status: DC
  Administered 2011-01-15: 1 g via INTRAVENOUS
  Filled 2011-01-15: qty 10

## 2011-01-15 NOTE — Progress Notes (Signed)
Pt sleeping upon RD entry, no family at bedside; continues on a Dysphagia 3, Nectar thickened liquid diet; PO intake at 25% per records; per RN pt taking Ensure Pudding and Magic Cup well; Meds/Labs reviewed; Nutrition Dx: Inadequate Oral Intake ongoing; Goal for pt to meet > 90% of estimated nutrition needs unmet at this time; weight stable at 53.7 kg (11/5); SLP recommending to continue current diet per Dysphagia treatment note 11/4.   Plan: Continue Magic Cup on meal trays and Ensure Pudding supplements between meals to promote tissue repletion.  RD to continue to follow for nutrition care plan.

## 2011-01-15 NOTE — Plan of Care (Signed)
Problem: Inadequate Intake (NI-2.1) Goal: Food and/or nutrient delivery Individualized approach for food/nutrient provision.  Outcome: Not Progressing Pt continues to only consume 25% of meals

## 2011-01-15 NOTE — Progress Notes (Signed)
Subjective: Patient's only complaint is intermittent pain in her back which is chronic. Ms. [name] has no complaints of chest pain shortness of breath but still requiring oxygen. I spoken with cardiology who verbally is a report that they do not feel that there is anything further they can do. They have no plans on any further testing or any revascularization. There appear this time is that this patient should be medically managed and that should be optimized. Objective: Filed Vitals:   01/15/11 0644 01/15/11 0943 01/15/11 1300 01/15/11 1504  BP: 143/71 118/65 133/76 130/70  Pulse: 76 79 82   Temp: 97.5 F (36.4 C)  97 F (36.1 C)   TempSrc: Oral  Oral   Resp: 19  20   Height:      Weight: 53.7 kg (118 lb 6.2 oz)     SpO2: 93%  93%    Weight change:   Intake/Output Summary (Last 24 hours) at 01/15/11 1724 Last data filed at 01/15/11 1300  Gross per 24 hour  Intake    790 ml  Output      2 ml  Net    788 ml    General: Alert, awake, oriented x3, in no acute distress.  HEENT: [place]/AT PEERL, EOMI Neck: Trachea midline,  no masses, no thyromegal,y no JVD, no carotid bruit OROPHARYNX:  Moist, No exudate/ erythema/lesions.  Heart: Regular rate and rhythm, without murmurs, rubs, gallops, PMI non-displaced, no heaves or thrills on palpation.  Lungs: Clear to auscultation, no wheezing or rhonchi noted. No increased vocal fremitus resonant to percussion  Abdomen: Soft, nontender, nondistended, positive bowel sounds, no masses no hepatosplenomegaly noted..  Musculoskeletal: No warm swelling or erythema around joints, no spinal tenderness noted. Psychiatric: Patient alert and oriented to person, but not to place and time. Lymph node survey: No cervical axillary or inguinal lymphadenopathy noted.     Lab Results:  Basename 01/15/11 0500 01/14/11 0940  NA 138 141  K 3.9 4.2  CL 99 100  CO2 31 32  GLUCOSE 97 128*  BUN 18 19  CREATININE 1.02 1.05  CALCIUM 9.3 9.1  MG -- --  PHOS  -- --   No results found for this basename: AST:2,ALT:2,ALKPHOS:2,BILITOT:2,PROT:2,ALBUMIN:2 in the last 72 hours No results found for this basename: LIPASE:2,AMYLASE:2 in the last 72 hours  Basename 01/15/11 0500  WBC 7.8  NEUTROABS 5.6  HGB 10.1*  HCT 32.8*  MCV 92.7  PLT 242   No results found for this basename: CKTOTAL:3,CKMB:3,CKMBINDEX:3,TROPONINI:3 in the last 72 hours  Basename 01/14/11 0500 01/13/11 0500  POCBNP 21783.0* 18768.0*   No results found for this basename: DDIMER:2 in the last 72 hours No results found for this basename: HGBA1C:2 in the last 72 hours No results found for this basename: CHOL:2,HDL:2,LDLCALC:2,TRIG:2,CHOLHDL:2,LDLDIRECT:2 in the last 72 hours No results found for this basename: TSH,T4TOTAL,FREET3,T3FREE,THYROIDAB in the last 72 hours No results found for this basename: VITAMINB12:2,FOLATE:2,FERRITIN:2,TIBC:2,IRON:2,RETICCTPCT:2 in the last 72 hours  Micro Results: Recent Results (from the past 240 hour(s))  URINE CULTURE     Status: Normal   Collection Time   01/07/11 10:47 PM      Component Value Range Status Comment   Specimen Description URINE, CATHETERIZED   Final    Special Requests NONE   Final    Setup Time [id number]   Final    Colony Count >=100,000 COLONIES/ML   Final    Culture     Final    Value: KLEBSIELLA PNEUMONIAE     Note:  Two isolates with different morphologies were identified as the same organism.The most resistant organism was reported.   Report Status 01/10/2011 FINAL   Final    Organism ID, Bacteria KLEBSIELLA PNEUMONIAE   Final   MRSA PCR SCREENING     Status: Abnormal   Collection Time   01/08/11  9:09 PM      Component Value Range Status Comment   MRSA by PCR POSITIVE (*) NEGATIVE  Final     Studies/Results: Ct Angio Chest W/cm &/or Wo Cm  01/08/2011  *RADIOLOGY REPORT*  Clinical Data:  Shortness of breath.  Weakness.  Low oxygen saturation.  CT ANGIOGRAPHY CHEST WITH CONTRAST  Technique:  Multidetector CT  imaging of the chest was performed using the standard protocol during bolus administration of intravenous contrast.  Multiplanar CT image reconstructions including MIPs were obtained to evaluate the vascular anatomy.  Contrast: 80mL OMNIPAQUE IOHEXOL 300 MG/ML IV SOLN  Comparison:  07/27/2010  Findings:  Technically adequate study with good opacification of the central and segmental pulmonary arteries.  No focal filling defects.  No evidence of significant pulmonary embolus.  Normal caliber thoracic aorta without dissection.  Aortic calcification. Calcification of coronary arteries.  Moderate sized bilateral pleural effusions with basilar atelectasis or consolidation bilaterally.  No pneumothorax.  No significant lymphadenopathy in the chest.  Degenerative changes in the thoracic spine.  Multiple thoracic vertebral compression deformities with stable compression of T9, increased compression of T11, and compression of L1, not visualized on the previous study.  Review of the MIP images confirms the above findings.  IMPRESSION: No evidence of significant pulmonary embolus.  Bilateral pleural effusions with basilar atelectasis or consolidation.  Multiple thoracic vertebral compression fractures with increasing compression of T11 since 07/17/2010.  Original Report Authenticated By: Marlon Pel, M.D.   Dg Chest Portable 1 View  01/10/2011  *RADIOLOGY REPORT*  Clinical Data: Chest pain.  PORTABLE CHEST - 1 VIEW  Comparison: 01/07/2011  Findings: Cardiomegaly.  Bilateral airspace disease and effusions, unchanged.  No acute bony abnormality.  IMPRESSION: No significant change.  Original Report Authenticated By: Cyndie Chime, M.D.   Dg Pneumonia Chest Port1v  01/07/2011  *RADIOLOGY REPORT*  Clinical Data: Short of breath  PORTABLE CHEST - 1 VIEW  Comparison: 07/27/2010  Findings: Diffuse bilateral airspace disease.  Pulmonary vascular congestion.  Bilateral pleural effusions and bibasilar atelectasis, left  greater than right.  IMPRESSION: Findings are compatible with congestive heart failure with edema and bilateral effusions, left greater than right.  Original Report Authenticated By: Camelia Phenes, M.D.   Dg Swallowing Func-no Report  01/09/2011  CLINICAL DATA: dysphagia   FLUOROSCOPY FOR SWALLOWING FUNCTION STUDY:  Fluoroscopy was provided for swallowing function study, which was  administered by a speech pathologist.  Final results and recommendations  from this study are contained within the speech pathology report.      Medications: I have reviewed the patient's current medications. Scheduled Meds:   . aspirin EC  325 mg Oral Daily  . cefTRIAXone (ROCEPHIN) IVPB 1 gram/50 mL D5W  1 g Intravenous Q24H  . clopidogrel  75 mg Oral Q breakfast  . enoxaparin (LOVENOX) injection  30 mg Subcutaneous QHS  . feeding supplement  237 mL Oral TID  . feeding supplement  1 Container Oral TID BM  . furosemide  20 mg Oral Daily  . hydrALAZINE  25 mg Oral Q8H  . insulin aspart  0-15 Units Subcutaneous TID WC  . insulin aspart  0-5 Units Subcutaneous QHS  .  levothyroxine  125 mcg Oral QAC breakfast  . lisinopril  5 mg Oral Daily  . metoprolol tartrate  50 mg Oral BID   Continuous Infusions:   . sodium chloride 20 mL/hr at 01/14/11 2303   PRN Meds:.acetaminophen, ondansetron, ondansetron Assessment/Plan: Patient Active Hospital Problem List: NSTEMI (non-ST elevation myocardial infarction) (01/14/2011)   Assessment: I discussed this patient's case with cardiology. From a cardiology standpoint he feels that there is nothing more that they can do. He'll see the patient in consultation tomorrow to reevaluate her and make any recommendations for further medical management.    Acute systolic heart failure (01/14/2011)   Assessment: See above.    Cardiomyopathy (01/14/2011)   Assessment: Patient has an ischemic cardiac myopathy and is not a candidate for neovascularization. Cardiology will see her  tomorrow to offer recommendations optimizing medical management.    PAF (paroxysmal atrial fibrillation) (01/14/2011)   Assessment:Patient has some paroxysmal atrial fibrillation however heart rate is controlled. Patient is not a candidate for Coumadin    Respiratory failure (01/14/2011)   Assessment: Respiratory failure is felt to be multifactorial there was some concern about component of pneumonia. The patient was treated with 5 days of azithromycin and will complete 10 days of Rocephin tomorrow. Rocephin be discontinued after tomorrow. A large part of her respiratory failure is felt to be secondary to her underlying cardiac condition which is not amenable to vascularization. The patient does have some systolic failure with pleural effusions which is contributing to her respiratory condition. It is likely this patient will have to be on long-term oxygen appear   Klebsiella infect (01/14/2011)   Assessment: Urinary tract infection has been fully treated   UTI (urinary tract infection) (01/14/2011)   Assessment see above    I called the patient's son at the number provided [phone] however have been unsuccessful last the nurses asked him to speak with me tomorrow.  LOS: 8 days

## 2011-01-15 NOTE — Progress Notes (Signed)
Clinical Social Work:  CSW spoke with patient and her son to discuss plans and offer support.  Pt and son would like for patient to return to [facility] skilled nursing facility upon discharge.  CSW spoke with admissions at facility who are agreeable to patient returning when ready for discharge.[name], Sherrie Sport

## 2011-01-16 DIAGNOSIS — E440 Moderate protein-calorie malnutrition: Secondary | ICD-10-CM | POA: Diagnosis present

## 2011-01-16 DIAGNOSIS — D649 Anemia, unspecified: Secondary | ICD-10-CM | POA: Insufficient documentation

## 2011-01-16 DIAGNOSIS — F329 Major depressive disorder, single episode, unspecified: Secondary | ICD-10-CM | POA: Insufficient documentation

## 2011-01-16 DIAGNOSIS — N184 Chronic kidney disease, stage 4 (severe): Secondary | ICD-10-CM | POA: Diagnosis present

## 2011-01-16 DIAGNOSIS — Z8781 Personal history of (healed) traumatic fracture: Secondary | ICD-10-CM | POA: Insufficient documentation

## 2011-01-16 DIAGNOSIS — R1310 Dysphagia, unspecified: Secondary | ICD-10-CM | POA: Diagnosis present

## 2011-01-16 DIAGNOSIS — I10 Essential (primary) hypertension: Secondary | ICD-10-CM | POA: Diagnosis present

## 2011-01-16 MED ORDER — ASPIRIN 325 MG PO TBEC: 325.0000 mg | DELAYED_RELEASE_TABLET | Freq: Every day | ORAL | Status: DC

## 2011-01-16 MED ORDER — METOPROLOL TARTRATE 50 MG PO TABS: 50.0000 mg | ORAL_TABLET | Freq: Two times a day (BID) | ORAL | Status: DC

## 2011-01-16 MED ORDER — ENSURE CLINICAL ST REVIGOR PO LIQD: 237.0000 mL | Freq: Three times a day (TID) | ORAL | Status: DC

## 2011-01-16 MED ORDER — INSULIN ASPART 100 UNIT/ML ~~LOC~~ SOLN: 0.0000 [IU] | Freq: Three times a day (TID) | SUBCUTANEOUS | Status: DC

## 2011-01-16 MED ORDER — LISINOPRIL 5 MG PO TABS: 5.0000 mg | ORAL_TABLET | Freq: Every day | ORAL | Status: DC

## 2011-01-16 MED ORDER — FUROSEMIDE 20 MG PO TABS: 20.0000 mg | ORAL_TABLET | Freq: Every day | ORAL | Status: DC

## 2011-01-16 MED ORDER — INSULIN ASPART 100 UNIT/ML ~~LOC~~ SOLN: 0.0000 [IU] | Freq: Every day | SUBCUTANEOUS | Status: DC

## 2011-01-16 MED ORDER — CLOPIDOGREL BISULFATE 75 MG PO TABS: 75.0000 mg | ORAL_TABLET | Freq: Every day | ORAL | Status: DC

## 2011-01-16 MED ORDER — HYDRALAZINE HCL 25 MG PO TABS: 25.0000 mg | ORAL_TABLET | Freq: Three times a day (TID) | ORAL | Status: DC

## 2011-01-16 NOTE — Discharge Summary (Addendum)
[name] MRN: [id number] DOB/AGE: January 01, 1917 77 y.o.  Admit date: 01/07/2011 Discharge date: 01/16/2011  Primary Care Physician:  Kimber Relic, MD, MD   Discharge Diagnoses:   Patient Active Problem List  Diagnoses  . Dysphagia, unspecified  . NSTEMI (non-ST elevation myocardial infarction)  . Acute systolic heart failure  . Cardiomyopathy  . PAF (paroxysmal atrial fibrillation)  . Respiratory failure  . Klebsiella infect  . UTI (urinary tract infection)  . HTN (hypertension)  . Dysphagia  . Protein-calorie malnutrition, moderate  . CKD (chronic kidney disease), stage IV  . History of compression fracture of vertebral column  . Depression  . Anemia    DISCHARGE MEDICATION: Current Discharge Medication List    START taking these medications   Details  aspirin EC 325 MG EC tablet Take 1 tablet (325 mg total) by mouth daily. Qty: 30 tablet, Refills: 0    clopidogrel (PLAVIX) 75 MG tablet Take 1 tablet (75 mg total) by mouth daily with breakfast. Qty: 30 tablet, Refills: 0    feeding supplement (ENSURE CLINICAL STRENGTH) LIQD Take 237 mLs by mouth 3 (three) times daily. Qty: 90 Bottle, Refills: 0    hydrALAZINE (APRESOLINE) 25 MG tablet Take 1 tablet (25 mg total) by mouth every 8 (eight) hours. Qty: 90 tablet, Refills: 0    !! insulin aspart (NOVOLOG) 100 UNIT/ML injection Inject 0-15 Units into the skin 3 (three) times daily with meals. Qty: 1 vial, Refills: 0    !! insulin aspart (NOVOLOG) 100 UNIT/ML injection Inject 0-5 Units into the skin at bedtime. Qty: 1 vial, Refills: 0    lisinopril (PRINIVIL,ZESTRIL) 5 MG tablet Take 1 tablet (5 mg total) by mouth daily. Qty: 30 tablet, Refills: 0    metoprolol (LOPRESSOR) 50 MG tablet Take 1 tablet (50 mg total) by mouth 2 (two) times daily. Qty: 60 tablet, Refills: 0     !! - Potential duplicate medications found. Please discuss with provider.    CONTINUE these medications which have CHANGED   Details    furosemide (LASIX) 20 MG tablet Take 1 tablet (20 mg total) by mouth daily. Qty: 30 tablet, Refills: 0      CONTINUE these medications which have NOT CHANGED   Details  acetaminophen (TYLENOL) 500 MG tablet Take 1,000 mg by mouth every 6 (six) hours as needed. For pain     bisacodyl (DULCOLAX) 5 MG EC tablet Take 5-10 mg by mouth daily as needed. For constipation    levothyroxine (SYNTHROID, LEVOTHROID) 125 MCG tablet Take 125 mcg by mouth daily.      omeprazole (PRILOSEC) 20 MG capsule Take 20 mg by mouth daily.      polyethylene glycol (MIRALAX) packet Take 17 g by mouth daily.       STOP taking these medications     aspirin 81 MG tablet      promethazine (PHENERGAN) 25 MG/ML injection            Consults: Treatment Team:  Chrystie Nose   SIGNIFICANT DIAGNOSTIC STUDIES:  Ct Angio Chest W/cm &/or Wo Cm  01/08/2011  *RADIOLOGY REPORT*  Clinical Data:  Shortness of breath.  Weakness.  Low oxygen saturation.  CT ANGIOGRAPHY CHEST WITH CONTRAST  Technique:  Multidetector CT imaging of the chest was performed using the standard protocol during bolus administration of intravenous contrast.  Multiplanar CT image reconstructions including MIPs were obtained to evaluate the vascular anatomy.  Contrast: 80mL OMNIPAQUE IOHEXOL 300 MG/ML IV SOLN  Comparison:  07/27/2010  Findings:  Technically adequate study with good opacification of the central and segmental pulmonary arteries.  No focal filling defects.  No evidence of significant pulmonary embolus.  Normal caliber thoracic aorta without dissection.  Aortic calcification. Calcification of coronary arteries.  Moderate sized bilateral pleural effusions with basilar atelectasis or consolidation bilaterally.  No pneumothorax.  No significant lymphadenopathy in the chest.  Degenerative changes in the thoracic spine.  Multiple thoracic vertebral compression deformities with stable compression of T9, increased compression of T11, and  compression of L1, not visualized on the previous study.  Review of the MIP images confirms the above findings.  IMPRESSION: No evidence of significant pulmonary embolus.  Bilateral pleural effusions with basilar atelectasis or consolidation.  Multiple thoracic vertebral compression fractures with increasing compression of T11 since 07/17/2010.  Original Report Authenticated By: Marlon Pel, M.D.   Dg Chest Portable 1 View  01/10/2011  *RADIOLOGY REPORT*  Clinical Data: Chest pain.  PORTABLE CHEST - 1 VIEW  Comparison: 01/07/2011  Findings: Cardiomegaly.  Bilateral airspace disease and effusions, unchanged.  No acute bony abnormality.  IMPRESSION: No significant change.  Original Report Authenticated By: Cyndie Chime, M.D.   Dg Pneumonia Chest Port1v  01/07/2011  *RADIOLOGY REPORT*  Clinical Data: Short of breath  PORTABLE CHEST - 1 VIEW  Comparison: 07/27/2010  Findings: Diffuse bilateral airspace disease.  Pulmonary vascular congestion.  Bilateral pleural effusions and bibasilar atelectasis, left greater than right.  IMPRESSION: Findings are compatible with congestive heart failure with edema and bilateral effusions, left greater than right.  Original Report Authenticated By: Camelia Phenes, M.D.   Dg Swallowing Func-no Report  01/09/2011  CLINICAL DATA: dysphagia   FLUOROSCOPY FOR SWALLOWING FUNCTION STUDY:  Fluoroscopy was provided for swallowing function study, which was  administered by a speech pathologist.  Final results and recommendations  from this study are contained within the speech pathology report.       ECHO: A 2-D echocardiogram was performed on 10:30 which showed decreased left ventricular systolic function with an EF of 35-40%. Patient also had parameters consistent with grade 1 diastolic dysfunction.       Recent Results (from the past 240 hour(s))  URINE CULTURE     Status: Normal   Collection Time   01/07/11 10:47 PM      Component Value Range Status Comment     Specimen Description URINE, CATHETERIZED   Final    Special Requests NONE   Final    Setup Time [id number]   Final    Colony Count >=100,000 COLONIES/ML   Final    Culture     Final    Value: KLEBSIELLA PNEUMONIAE     Note: Two isolates with different morphologies were identified as the same organism.The most resistant organism was reported.   Report Status 01/10/2011 FINAL   Final    Organism ID, Bacteria KLEBSIELLA PNEUMONIAE   Final   MRSA PCR SCREENING     Status: Abnormal   Collection Time   01/08/11  9:09 PM      Component Value Range Status Comment   MRSA by PCR POSITIVE (*) NEGATIVE  Final     BRIEF ADMITTING H & P:A 77 year old female with a history of  vertebral column fractures; hypothyroidism; previous UTI; previous  hypoxemia, previously thought due to poor respiratory excursions  presents with shortness of breath and pulmonary edema and found to have  a UTI.  The patient is completely unable to give any history at this  point;  however, per discussion with the ED staff who spoke with the patient's  son who was here just before I went to evaluate the patient, it appears  that the patient has been living at [facility] in [place]  where per some of the documentation they have provided it appears that  they have gotten a chest x-ray in the past week, which showed worsening  pleural effusions and findings consistent with CHF. They had up  titrated her to 40 mg of p.o. Lasix; however, it appears that earlier  today she was much more short of breath and was placed on O2 by the  nursing home. She was brought to emergency room for further evaluation  where her initial vital signs were 124/53 with a pulse of 102,  respiratory rate of 34, and unclear oxygenation level, but per report  was hypoxic on room air. She appears to have spiked a temperature max  of 100.3 while she was here in the emergency room. Her workup in the ED  showed an elevated D-dimer, so they got  a CTA, which did not show a PE,  but showed bilateral effusions and atelectasis. This was after a chest  x-ray, which was concerning for pulmonary edema and bilateral effusions.  She also showed evidence of UTI for which she got 1 g of ceftriaxone in  the ED. She also is hypernatremic at 152 and her bicarb is a bit higher  at 33 and her renal function is just a bit elevated as well. She is  being admitted for hypoxia and UTI.     Hospital Course:  Present on Admission:  .NSTEMI (non-ST elevation myocardial infarction): The patient had elevation of troponins. It was initially thought to have an acute non-ST elevated MI. However the patient was seen in consultation by cardiology that this is subacute and the patient was not a candidate for any revascularization. They recommended medical management. This included Plavix aspirin and Toprol. However A statin was not started in this elderly patient as it was felt that the risks outweighed the benefits it is already free patient with decreased muscle mass.. Cardiology will see the patient today prior to discharge to offer any additional recommendations and addendum will be made to that regard.   .Acute systolic heart failure: The patient had an acute on chronic systolic heart failure and received diuretics. The patient is polys compensated that she will be she's back to her baseline of 2 L of oxygen on a daily basis continuously  .Cardiomyopathy: Patient has a cardiomyopathy with an EF of 35-40% as noted above. Patient is on lisinopril for left ventricular remodeling.   Marland KitchenPAF (paroxysmal atrial fibrillation): The patient exhibited paroxysmal atrial fibrillation her heart rate is controlled however she's not a candidate for Coumadin.   .Klebsiella infect: Patient has a Klebsiella urinary tract infection was fully been treated with Rocephin.  Marland KitchenUTI (urinary tract infection): Please see above.   .Malnutrition of moderate degree: The patient has  moderate protein calorie malnutrition. She is very poorly. All the supplements have been offered she's had very poor intake. This is been discussed with her son is her healthcare power of attorney he will continue to encourage oral intake. The plan so desires for any artificial nutrition for this patient.  Marland KitchenHTN (hypertension): Patient's blood pressures were controlled during this hospitalization.  Marland KitchenDysphagia: The patient was seen by physical therapy recommendations made for modified textures of food and aspiration precautions.  .CKD (chronic kidney disease), stage IV:  The patient has chronic kidney disease which is stable.  Vertebral compression fractures/chronic pain: The patient has a history of vertebral compression fractures which causes her chronic back pain. This is stable during this hospitalization.   Disposition and Follow-up: The patient is being discharged to skilled nursing facility. She's to followup with Dr. Murray Hodgkins within 3-5 days. Discharge Orders    Future Orders Please Complete By Expires   Diet - low sodium heart healthy      Increase activity slowly         DISCHARGE EXAM:  Blood pressure 118/63, pulse 75, temperature 98.4 F (36.9 C), temperature source Oral, resp. rate 24, height 5\' 6"  (1.676 m), weight 53.7 kg (118 lb 6.2 oz), SpO2 92.00%. General: Alert, awake, oriented x3, in no acute distress.  HEENT: [place]/AT PEERL, EOMI  Neck: Trachea midline, no masses, no thyromegal,y no JVD, no carotid bruit  OROPHARYNX: Moist, No exudate/ erythema/lesions.  Heart: Regular rate and rhythm, without murmurs, rubs, gallops, PMI non-displaced, no heaves or thrills on palpation.  Lungs: Clear to auscultation, no wheezing or rhonchi noted. No increased vocal fremitus resonant to percussion  Abdomen: Soft, nontender, nondistended, positive bowel sounds, no masses no hepatosplenomegaly noted..  Musculoskeletal: No warm swelling or erythema around joints, no spinal tenderness  noted.  Psychiatric: Patient alert and oriented to person, but not to place and time.  Lymph node survey: No cervical axillary or inguinal lymphadenopathy noted.        Basename 01/15/11 0500 01/14/11 0940  NA 138 141  K 3.9 4.2  CL 99 100  CO2 31 32  GLUCOSE 97 128*  BUN 18 19  CREATININE 1.02 1.05  CALCIUM 9.3 9.1  MG -- --  PHOS -- --   No results found for this basename: AST:2,ALT:2,ALKPHOS:2,BILITOT:2,PROT:2,ALBUMIN:2 in the last 72 hours No results found for this basename: LIPASE:2,AMYLASE:2 in the last 72 hours  Basename 01/15/11 0500  WBC 7.8  NEUTROABS 5.6  HGB 10.1*  HCT 32.8*  MCV 92.7  PLT 242    Signed: Devontae Casasola A. 01/16/2011, 9:44 AM

## 2011-01-20 ENCOUNTER — Encounter (HOSPITAL_COMMUNITY): Payer: Self-pay | Admitting: *Deleted

## 2011-01-20 ENCOUNTER — Inpatient Hospital Stay (HOSPITAL_COMMUNITY)
Admission: EM | Admit: 2011-01-20 | Discharge: 2011-01-23 | DRG: 291 | Disposition: A | Discharge status: Skilled Nursing Facility | Payer: Medicare Other | Attending: Internal Medicine | Admitting: Internal Medicine

## 2011-01-20 ENCOUNTER — Emergency Department (HOSPITAL_COMMUNITY): Payer: Medicare Other

## 2011-01-20 ENCOUNTER — Other Ambulatory Visit: Payer: Self-pay

## 2011-01-20 DIAGNOSIS — I5023 Acute on chronic systolic (congestive) heart failure: Secondary | ICD-10-CM | POA: Diagnosis present

## 2011-01-20 DIAGNOSIS — R1310 Dysphagia, unspecified: Secondary | ICD-10-CM | POA: Diagnosis present

## 2011-01-20 DIAGNOSIS — I509 Heart failure, unspecified: Secondary | ICD-10-CM

## 2011-01-20 DIAGNOSIS — E039 Hypothyroidism, unspecified: Secondary | ICD-10-CM | POA: Diagnosis present

## 2011-01-20 DIAGNOSIS — Z66 Do not resuscitate: Secondary | ICD-10-CM | POA: Diagnosis present

## 2011-01-20 DIAGNOSIS — F3289 Other specified depressive episodes: Secondary | ICD-10-CM | POA: Diagnosis present

## 2011-01-20 DIAGNOSIS — Z79899 Other long term (current) drug therapy: Secondary | ICD-10-CM

## 2011-01-20 DIAGNOSIS — J9620 Acute and chronic respiratory failure, unspecified whether with hypoxia or hypercapnia: Secondary | ICD-10-CM | POA: Diagnosis present

## 2011-01-20 DIAGNOSIS — Z7982 Long term (current) use of aspirin: Secondary | ICD-10-CM

## 2011-01-20 DIAGNOSIS — R627 Adult failure to thrive: Secondary | ICD-10-CM | POA: Diagnosis present

## 2011-01-20 DIAGNOSIS — Z85828 Personal history of other malignant neoplasm of skin: Secondary | ICD-10-CM

## 2011-01-20 DIAGNOSIS — F329 Major depressive disorder, single episode, unspecified: Secondary | ICD-10-CM | POA: Diagnosis present

## 2011-01-20 DIAGNOSIS — E870 Hyperosmolality and hypernatremia: Secondary | ICD-10-CM | POA: Diagnosis present

## 2011-01-20 DIAGNOSIS — Z96649 Presence of unspecified artificial hip joint: Secondary | ICD-10-CM

## 2011-01-20 DIAGNOSIS — N184 Chronic kidney disease, stage 4 (severe): Secondary | ICD-10-CM | POA: Diagnosis present

## 2011-01-20 DIAGNOSIS — Z7902 Long term (current) use of antithrombotics/antiplatelets: Secondary | ICD-10-CM

## 2011-01-20 LAB — CBC
MCHC: 30.1 g/dL (ref 30.0–36.0)
MCV: 93.8 fL (ref 78.0–100.0)
Platelets: 253 10*3/uL (ref 150–400)
Platelets: 263 10*3/uL (ref 150–400)
RBC: 3.9 MIL/uL (ref 3.87–5.11)
RDW: 18.4 % — ABNORMAL HIGH (ref 11.5–15.5)
RDW: 18.5 % — ABNORMAL HIGH (ref 11.5–15.5)
WBC: 9.3 10*3/uL (ref 4.0–10.5)
WBC: 7.7 10*3/uL (ref 4.0–10.5)

## 2011-01-20 LAB — PRO B NATRIURETIC PEPTIDE: Pro B Natriuretic peptide (BNP): 26478 pg/mL — ABNORMAL HIGH (ref 0–450)

## 2011-01-20 LAB — POCT I-STAT TROPONIN I: Troponin i, poc: 0.03 ng/mL (ref 0.00–0.08)

## 2011-01-20 LAB — URINALYSIS, ROUTINE W REFLEX MICROSCOPIC
Hgb urine dipstick: NEGATIVE
Nitrite: NEGATIVE
Protein, ur: NEGATIVE mg/dL
Urobilinogen, UA: 0.2 mg/dL (ref 0.0–1.0)

## 2011-01-20 LAB — CREATININE, SERUM
Creatinine, Ser: 1.27 mg/dL — ABNORMAL HIGH (ref 0.50–1.10)
GFR calc Af Amer: 41 mL/min — ABNORMAL LOW (ref >90)

## 2011-01-20 LAB — BASIC METABOLIC PANEL
GFR calc Af Amer: 38 mL/min — ABNORMAL LOW (ref >90)
GFR calc non Af Amer: 33 mL/min — ABNORMAL LOW (ref >90)
Potassium: 3.9 mEq/L (ref 3.5–5.1)
Sodium: 147 mEq/L — ABNORMAL HIGH (ref 135–145)

## 2011-01-20 LAB — MRSA PCR SCREENING: MRSA by PCR: NEGATIVE

## 2011-01-20 MED ORDER — FUROSEMIDE 10 MG/ML IJ SOLN
INTRAMUSCULAR | Status: AC
  Filled 2011-01-20: qty 4

## 2011-01-20 MED ORDER — SODIUM CHLORIDE 0.9 % IV SOLN
250.0000 mL | INTRAVENOUS | Status: DC
  Administered 2011-01-20: 250 mL via INTRAVENOUS

## 2011-01-20 MED ORDER — SODIUM CHLORIDE 0.9 % IJ SOLN
3.0000 mL | Freq: Two times a day (BID) | INTRAMUSCULAR | Status: DC
  Administered 2011-01-20 – 2011-01-23 (×6): 3 mL via INTRAVENOUS

## 2011-01-20 MED ORDER — ACETAMINOPHEN 325 MG PO TABS: 650.0000 mg | ORAL_TABLET | ORAL | Status: DC | PRN

## 2011-01-20 MED ORDER — ENOXAPARIN SODIUM 40 MG/0.4ML ~~LOC~~ SOLN
40.0000 mg | SUBCUTANEOUS | Status: DC
Start: 2011-01-20 — End: 2011-01-21
  Administered 2011-01-20: 40 mg via SUBCUTANEOUS
  Filled 2011-01-20 (×2): qty 0.4

## 2011-01-20 MED ORDER — PANTOPRAZOLE SODIUM 40 MG PO TBEC
40.0000 mg | DELAYED_RELEASE_TABLET | Freq: Every day | ORAL | Status: DC
  Administered 2011-01-20 – 2011-01-23 (×4): 40 mg via ORAL
  Filled 2011-01-20 (×2): qty 1

## 2011-01-20 MED ORDER — SODIUM CHLORIDE 0.9 % IV SOLN: INTRAVENOUS | Status: DC

## 2011-01-20 MED ORDER — LISINOPRIL 5 MG PO TABS
5.0000 mg | ORAL_TABLET | Freq: Every day | ORAL | Status: DC
  Administered 2011-01-20 – 2011-01-23 (×4): 5 mg via ORAL
  Filled 2011-01-20 (×4): qty 1

## 2011-01-20 MED ORDER — FUROSEMIDE 10 MG/ML IJ SOLN
INTRAMUSCULAR | Status: AC
  Administered 2011-01-20: 10 mg via INTRAVENOUS
  Filled 2011-01-20: qty 2

## 2011-01-20 MED ORDER — POLYETHYLENE GLYCOL 3350 17 G PO PACK
17.0000 g | PACK | Freq: Every day | ORAL | Status: DC
  Administered 2011-01-21 – 2011-01-23 (×3): 17 g via ORAL
  Filled 2011-01-20 (×4): qty 1

## 2011-01-20 MED ORDER — ONDANSETRON HCL 4 MG/2ML IJ SOLN: 4.0000 mg | Freq: Four times a day (QID) | INTRAMUSCULAR | Status: DC | PRN

## 2011-01-20 MED ORDER — LEVOTHYROXINE SODIUM 125 MCG PO TABS
125.0000 ug | ORAL_TABLET | Freq: Every day | ORAL | Status: DC
  Administered 2011-01-20 – 2011-01-23 (×4): 125 ug via ORAL
  Filled 2011-01-20 (×4): qty 1

## 2011-01-20 MED ORDER — CLOPIDOGREL BISULFATE 75 MG PO TABS
75.0000 mg | ORAL_TABLET | Freq: Every day | ORAL | Status: DC
  Administered 2011-01-21 – 2011-01-23 (×3): 75 mg via ORAL
  Filled 2011-01-20 (×4): qty 1

## 2011-01-20 MED ORDER — FUROSEMIDE 10 MG/ML IJ SOLN
40.0000 mg | Freq: Two times a day (BID) | INTRAMUSCULAR | Status: DC
  Administered 2011-01-21 – 2011-01-23 (×5): 40 mg via INTRAVENOUS
  Filled 2011-01-20 (×6): qty 4

## 2011-01-20 MED ORDER — SODIUM CHLORIDE 0.9 % IJ SOLN: 3.0000 mL | INTRAMUSCULAR | Status: DC | PRN

## 2011-01-20 MED ORDER — BISACODYL 5 MG PO TBEC: 5.0000 mg | DELAYED_RELEASE_TABLET | Freq: Every day | ORAL | Status: DC | PRN

## 2011-01-20 MED ORDER — ASPIRIN EC 325 MG PO TBEC
325.0000 mg | DELAYED_RELEASE_TABLET | Freq: Every day | ORAL | Status: DC
  Administered 2011-01-20 – 2011-01-23 (×4): 325 mg via ORAL
  Filled 2011-01-20 (×4): qty 1

## 2011-01-20 MED ORDER — FUROSEMIDE 10 MG/ML IJ SOLN
40.0000 mg | Freq: Once | INTRAMUSCULAR | Status: AC
  Administered 2011-01-20: 40 mg via INTRAVENOUS
  Filled 2011-01-20: qty 4

## 2011-01-20 MED ORDER — METOPROLOL TARTRATE 50 MG PO TABS
50.0000 mg | ORAL_TABLET | Freq: Two times a day (BID) | ORAL | Status: DC
Start: 2011-01-20 — End: 2011-01-23
  Administered 2011-01-20 – 2011-01-23 (×6): 50 mg via ORAL
  Filled 2011-01-20 (×7): qty 1

## 2011-01-20 MED ORDER — ENSURE CLINICAL ST REVIGOR PO LIQD
237.0000 mL | Freq: Three times a day (TID) | ORAL | Status: DC
  Administered 2011-01-20 – 2011-01-23 (×8): 237 mL via ORAL

## 2011-01-20 MED ORDER — HYDRALAZINE HCL 25 MG PO TABS
25.0000 mg | ORAL_TABLET | Freq: Three times a day (TID) | ORAL | Status: DC
  Administered 2011-01-20 – 2011-01-23 (×8): 25 mg via ORAL
  Filled 2011-01-20 (×11): qty 1

## 2011-01-20 MED ORDER — NITROGLYCERIN 0.4 MG SL SUBL
0.4000 mg | SUBLINGUAL_TABLET | Freq: Once | SUBLINGUAL | Status: AC
  Administered 2011-01-20: 0.4 mg via SUBLINGUAL

## 2011-01-20 NOTE — ED Provider Notes (Signed)
History     CSN: [id number] Arrival date & time: 01/20/2011  9:35 AM   First MD Initiated Contact with Patient 01/20/11 [phone]      Chief Complaint  Patient presents with  . Respiratory Distress    (Consider location/radiation/quality/duration/timing/severity/associated sxs/prior treatment) The history is provided by the patient. The history is limited by the condition of the patient (level 5 caveat, due to pt condition/illness/dementia).  pt w  Hx chf, recent admission for same, presents noted at [facility] this morning. Acute onset, constant. Pt not verbally responsive to questions, hx dementia. Is on bipap. No fever. No chest pain.   Past Medical History  Diagnosis Date  . Hypothyroidism   . Depression   . Migraines   . Dental pulp degeneration   . Corns and callus   . Gait disorder   . Urinary incontinence   . Skin cancer   . Cataract   . Skin cancer   . Insomnia     Past Surgical History  Procedure Date  . Total hip arthroplasty   . Cataract extraction   . Abdominal hysterectomy     Family History  Problem Relation Age of Onset  . Colon cancer Neg Hx     History  Substance Use Topics  . Smoking status: Never Smoker   . Smokeless tobacco: Never Used  . Alcohol Use: No    OB History    Grav Para Term Preterm Abortions TAB SAB Ect Mult Living                  Review of Systems  Unable to perform ROS level 5 caveat  Allergies  Anaprox and Hydrocodone  Home Medications   Current Outpatient Rx  Name Route Sig Dispense Refill  . ASPIRIN 325 MG PO TABS Oral Take 325 mg by mouth daily.      Marland Kitchen CLOPIDOGREL BISULFATE 75 MG PO TABS Oral Take 75 mg by mouth daily.      Marland Kitchen ENSURE CLINICAL ST REVIGOR PO LIQD Oral Take 237 mLs by mouth 3 (three) times daily. 90 Bottle 0  . FUROSEMIDE 10 MG/ML IJ SOLN Intramuscular Inject 25 mg into the muscle once.      . FUROSEMIDE 20 MG PO TABS Oral Take 20 mg by mouth 2 (two) times daily.      Marland Kitchen HYDRALAZINE HCL 25 MG PO TABS  Oral Take 1 tablet (25 mg total) by mouth every 8 (eight) hours. 90 tablet 0  . INSULIN ASPART 100 UNIT/ML [place] SOLN Subcutaneous Inject 0-15 Units into the skin 3 (three) times daily before meals. Sliding scale     . LEVOTHYROXINE SODIUM 125 MCG PO TABS Oral Take 125 mcg by mouth daily.      Marland Kitchen LISINOPRIL 5 MG PO TABS Oral Take 5 mg by mouth daily.      Marland Kitchen METOPROLOL TARTRATE 50 MG PO TABS Oral Take 1 tablet (50 mg total) by mouth 2 (two) times daily. 60 tablet 0  . NITROGLYCERIN 0.4 MG SL SUBL Sublingual Place 0.4 mg under the tongue once.      . OMEPRAZOLE 20 MG PO CPDR Oral Take 20 mg by mouth daily.      Marland Kitchen POLYETHYLENE GLYCOL 3350 PO PACK Oral Take 17 g by mouth daily.     . ACETAMINOPHEN 500 MG PO TABS Oral Take 1,000 mg by mouth every 6 (six) hours as needed. For pain     . BISACODYL 5 MG PO TBEC Oral Take 5-10  mg by mouth daily as needed. For constipation      BP 170/88  Pulse 74  Resp 26  SpO2 100%  Physical Exam  Nursing note and vitals reviewed. Constitutional: She appears well-developed and well-nourished.  HENT:  Head: Atraumatic.  Eyes: Conjunctivae are normal. No scleral icterus.  Neck: Neck supple. No tracheal deviation present.  Cardiovascular: Normal rate and normal heart sounds.  Exam reveals no gallop and no friction rub.   No murmur heard. Pulmonary/Chest: She is in respiratory distress. She has rales.  Abdominal: Soft. Normal appearance and bowel sounds are normal. She exhibits no distension. There is no tenderness.  Musculoskeletal: She exhibits no tenderness.       Mild bil ankle edema  Neurological: She is alert.       Awake and alert appearing  Skin: Skin is warm and dry. No rash noted.  Psychiatric: She has a normal mood and affect.    ED Course  Procedures (including critical care time)   Labs Reviewed  CBC  BASIC METABOLIC PANEL  I-STAT TROPONIN I  PRO B NATRIURETIC PEPTIDE   No results found. Results for orders placed during the hospital  encounter of 01/20/11  CBC      Component Value Range   WBC 9.3  4.0 - 10.5 (K/uL)   RBC 3.79 (*) 3.87 - 5.11 (MIL/uL)   Hemoglobin 10.8 (*) 12.0 - 15.0 (g/dL)   HCT 16.1 (*) 09.6 - 46.0 (%)   MCV 94.7  78.0 - 100.0 (fL)   MCH 28.5  26.0 - 34.0 (pg)   MCHC 30.1  30.0 - 36.0 (g/dL)   RDW 04.5 (*) 40.9 - 15.5 (%)   Platelets 253  150 - 400 (K/uL)  BASIC METABOLIC PANEL      Component Value Range   Sodium 147 (*) 135 - 145 (mEq/L)   Potassium 3.9  3.5 - 5.1 (mEq/L)   Chloride 104  96 - 112 (mEq/L)   CO2 34 (*) 19 - 32 (mEq/L)   Glucose, Bld 126 (*) 70 - 99 (mg/dL)   BUN 27 (*) 6 - 23 (mg/dL)   Creatinine, Ser 8.11 (*) 0.50 - 1.10 (mg/dL)   Calcium 9.8  8.4 - 91.4 (mg/dL)   GFR calc non Af Amer 33 (*) >90 (mL/min)   GFR calc Af Amer 38 (*) >90 (mL/min)  PRO B NATRIURETIC PEPTIDE      Component Value Range   BNP, POC 26478.0 (*) 0 - 450 (pg/mL)  URINALYSIS, ROUTINE W REFLEX MICROSCOPIC      Component Value Range   Color, Urine YELLOW  YELLOW    Appearance CLEAR  CLEAR    Specific Gravity, Urine 1.009  1.005 - 1.030    pH 5.0  5.0 - 8.0    Glucose, UA NEGATIVE  NEGATIVE (mg/dL)   Hgb urine dipstick NEGATIVE  NEGATIVE    Bilirubin Urine NEGATIVE  NEGATIVE    Ketones, ur NEGATIVE  NEGATIVE (mg/dL)   Protein, ur NEGATIVE  NEGATIVE (mg/dL)   Urobilinogen, UA 0.2  0.0 - 1.0 (mg/dL)   Nitrite NEGATIVE  NEGATIVE    Leukocytes, UA NEGATIVE  NEGATIVE   POCT I-STAT TROPONIN I      Component Value Range   Troponin i, poc 0.03  0.00 - 0.08 (ng/mL)   Comment 3            Ct Angio Chest W/cm &/or Wo Cm  01/08/2011  *RADIOLOGY REPORT*  Clinical Data:  Shortness of breath.  Weakness.  Low oxygen saturation.  CT ANGIOGRAPHY CHEST WITH CONTRAST  Technique:  Multidetector CT imaging of the chest was performed using the standard protocol during bolus administration of intravenous contrast.  Multiplanar CT image reconstructions including MIPs were obtained to evaluate the vascular anatomy.   Contrast: 80mL OMNIPAQUE IOHEXOL 300 MG/ML IV SOLN  Comparison:  07/27/2010  Findings:  Technically adequate study with good opacification of the central and segmental pulmonary arteries.  No focal filling defects.  No evidence of significant pulmonary embolus.  Normal caliber thoracic aorta without dissection.  Aortic calcification. Calcification of coronary arteries.  Moderate sized bilateral pleural effusions with basilar atelectasis or consolidation bilaterally.  No pneumothorax.  No significant lymphadenopathy in the chest.  Degenerative changes in the thoracic spine.  Multiple thoracic vertebral compression deformities with stable compression of T9, increased compression of T11, and compression of L1, not visualized on the previous study.  Review of the MIP images confirms the above findings.  IMPRESSION: No evidence of significant pulmonary embolus.  Bilateral pleural effusions with basilar atelectasis or consolidation.  Multiple thoracic vertebral compression fractures with increasing compression of T11 since 07/17/2010.  Original Report Authenticated By: Marlon Pel, M.D.   Dg Chest Portable 1 View  01/20/2011  *RADIOLOGY REPORT*  Clinical Data: Short of breath  PORTABLE CHEST - 1 VIEW  Comparison: Chest radiograph 01/10/2011  Findings: Stable enlarged heart silhouette.  Bilateral pleural effusion are not changed from prior.  There is mild interstitial edema/pulmonary edema also unchanged.  IMPRESSION:  1.  No interval change.  2.  Cardiomegaly, bilateral pleural effusions and mild pulmonary edema.  Original Report Authenticated By: Genevive Bi, M.D.   Dg Chest Portable 1 View  01/10/2011  *RADIOLOGY REPORT*  Clinical Data: Chest pain.  PORTABLE CHEST - 1 VIEW  Comparison: 01/07/2011  Findings: Cardiomegaly.  Bilateral airspace disease and effusions, unchanged.  No acute bony abnormality.  IMPRESSION: No significant change.  Original Report Authenticated By: Cyndie Chime, M.D.   Dg  Pneumonia Chest Port1v  01/07/2011  *RADIOLOGY REPORT*  Clinical Data: Short of breath  PORTABLE CHEST - 1 VIEW  Comparison: 07/27/2010  Findings: Diffuse bilateral airspace disease.  Pulmonary vascular congestion.  Bilateral pleural effusions and bibasilar atelectasis, left greater than right.  IMPRESSION: Findings are compatible with congestive heart failure with edema and bilateral effusions, left greater than right.  Original Report Authenticated By: Camelia Phenes, M.D.   Dg Swallowing Func-no Report  01/09/2011  CLINICAL DATA: dysphagia   FLUOROSCOPY FOR SWALLOWING FUNCTION STUDY:  Fluoroscopy was provided for swallowing function study, which was  administered by a speech pathologist.  Final results and recommendations  from this study are contained within the speech pathology report.        No diagnosis found.    MDM  Reviewed prior charts, labs and xrays. Reviewed recent admit h and p. Reviewed nursing notes.   Date: 01/20/2011  Rate: 98  Rhythm: normal sinus rhythm  QRS Axis: normal  Intervals: normal  ST/T Wave abnormalities: nonspecific ST/T changes  Conduction Disutrbances:none  Narrative Interpretation:   Old EKG Reviewed: unchanged    Pt on bipap in ed. Lasix 40 iv. cxr and labs, exam, c/w chf. Good urine output in response to lasix. Pt able to be weaned off bipap. On 4 liters, o2 sat 97%.  [facility] admit. Indicates admit to team 1, tele. Memon.   CRITICAL CARE Performed by: Suzi Roots   Total critical care time: 45  Critical care  time was exclusive of separately billable procedures and treating other patients.  Critical care was necessary to treat or prevent imminent or life-threatening deterioration.  Critical care was time spent personally by me on the following activities: development of treatment plan with patient and/or surrogate as well as nursing, discussions with consultants, evaluation of patient's response to treatment, examination of patient,  obtaining history from patient or surrogate, ordering and performing treatments and interventions, ordering and review of laboratory studies, ordering and review of radiographic studies, pulse oximetry and re-evaluation of patient's condition.       Suzi Roots, MD 01/20/11 [phone]

## 2011-01-20 NOTE — ED Notes (Signed)
Pt's urine is dark and cloudy and has a very strong odor. Pt also has a very very red vagina and cheesie matter all over the outside and inside. PT's bottom is very very red and splochie. 10:48 am JG

## 2011-01-20 NOTE — H&P (Signed)
PCP:  Kimber Relic, MD, MD   DOA:  01/20/2011  9:35 AM  Chief Complaint:  Shortness of breath  HPI: This is a 78 y/o female who was recently discharged from the hospital after being treated for acute CHF.  She resides at [facility], and according to her son, at the time of discharge, she was looking better than she had in a long time. Patient was in her usual state of health when this morning she woke up from sleep and was unable to breath.  She is a poor historian, but relates that she did not have any chest pain, just had a large amount of difficulty breathing.  EMS was called and patient was given lasix and started on a bipap.  On arrival to the ED she was given more lasix with good effect and was able to be taken off the bipap.  Currently she is on 3-4L of oxygen via [place], her baseline is 2L.  Her son reports that patient has not been doing well overall since may of this year when she had suffered a fall.  Since that time she has spent more of her time in bed, occasionally getting up to walk when picked up by the nursing staff.  Her appetite has been poor.  She denies any cough/fever/nausea or lightheadedness.  Allergies: Allergies  Allergen Reactions  . Anaprox (Naproxen Sodium)     Unknown reaction  . Hydrocodone     Unknown reaction    Prior to Admission medications   Medication Sig Start Date End Date Taking? Authorizing Provider  aspirin 325 MG tablet Take 325 mg by mouth daily.     Yes Historical Provider, MD  clopidogrel (PLAVIX) 75 MG tablet Take 75 mg by mouth daily.     Yes Historical Provider, MD  feeding supplement (ENSURE CLINICAL STRENGTH) LIQD Take 237 mLs by mouth 3 (three) times daily. 01/16/11  Yes Michelle A. Matthews  furosemide (LASIX) 10 MG/ML injection Inject 25 mg into the muscle once.     Yes Historical Provider, MD  furosemide (LASIX) 20 MG tablet Take 20 mg by mouth 2 (two) times daily.     Yes Historical Provider, MD  hydrALAZINE (APRESOLINE) 25 MG tablet  Take 1 tablet (25 mg total) by mouth every 8 (eight) hours. 01/16/11 01/16/12 Yes Michelle A. Matthews  insulin aspart (NOVOLOG) 100 UNIT/ML injection Inject 0-15 Units into the skin 3 (three) times daily before meals. Sliding scale    Yes Historical Provider, MD  levothyroxine (SYNTHROID, LEVOTHROID) 125 MCG tablet Take 125 mcg by mouth daily.     Yes Historical Provider, MD  lisinopril (PRINIVIL,ZESTRIL) 5 MG tablet Take 5 mg by mouth daily.     Yes Historical Provider, MD  metoprolol (LOPRESSOR) 50 MG tablet Take 1 tablet (50 mg total) by mouth 2 (two) times daily. 01/16/11 01/16/12 Yes Michelle A. Matthews  nitroGLYCERIN (NITROSTAT) 0.4 MG SL tablet Place 0.4 mg under the tongue once.     Yes Historical Provider, MD  omeprazole (PRILOSEC) 20 MG capsule Take 20 mg by mouth daily.     Yes Historical Provider, MD  polyethylene glycol (MIRALAX) packet Take 17 g by mouth daily.    Yes Historical Provider, MD  acetaminophen (TYLENOL) 500 MG tablet Take 1,000 mg by mouth every 6 (six) hours as needed. For pain     Historical Provider, MD  bisacodyl (DULCOLAX) 5 MG EC tablet Take 5-10 mg by mouth daily as needed. For constipation  Historical Provider, MD    Past Medical History  Diagnosis Date  . Hypothyroidism   . Depression   . Migraines   . Dental pulp degeneration   . Corns and callus   . Gait disorder   . Urinary incontinence   . Skin cancer   . Cataract   . Skin cancer   . Insomnia     Past Surgical History  Procedure Date  . Total hip arthroplasty   . Cataract extraction   . Abdominal hysterectomy     Social History:  reports that she has never smoked. She has never used smokeless tobacco. She reports that she does not drink alcohol or use illicit drugs.  Family History  Problem Relation Age of Onset  . Colon cancer Neg Hx     Review of Systems:  Constitutional: Denies fever, chills, diaphoresis, appetite change and fatigue.  HEENT: Denies photophobia, eye pain, redness,  hearing loss, ear pain, congestion, sore throat, rhinorrhea, sneezing, mouth sores, trouble swallowing, neck pain, neck stiffness and tinnitus.   Respiratory: Denies cough, chest tightness,  and wheezing. Positive for shortness of breath Cardiovascular: Denies chest pain, palpitations and leg swelling.  Gastrointestinal: Denies nausea, vomiting, abdominal pain, diarrhea, constipation, blood in stool and abdominal distention.  Genitourinary: Denies dysuria, urgency, frequency, hematuria, flank pain and difficulty urinating.  Musculoskeletal: Denies myalgias, back pain, joint swelling, arthralgias and gait problem.  Skin: Denies pallor, rash and wound.  Neurological: Denies dizziness, seizures, syncope, weakness, light-headedness, numbness and headaches.  Hematological: Denies adenopathy. Easy bruising, personal or family bleeding history  Psychiatric/Behavioral: Denies suicidal ideation, mood changes, confusion, nervousness, sleep disturbance and agitation   Physical Exam:  Filed Vitals:   01/20/11 1219 01/20/11 1348 01/20/11 1446 01/20/11 1449  BP: 155/52 151/81 124/71   Pulse: 60     TempSrc:      Resp:      SpO2: 100% 100% 96% 97%    Constitutional: Vital signs reviewed.  Patient is in no acute distress and cooperative with exam.   Head: Normocephalic and atraumatic Ear: TM normal bilaterally Mouth: no erythema or exudates, MMM Eyes: PERRL, EOMI, conjunctivae normal, No scleral icterus.  Neck: Supple, Trachea midline normal ROM, mass, thyromegaly, or carotid bruit present.  Cardiovascular: RRR, S1 normal, S2 normal, no MRG, pulses symmetric and intact bilaterally Pulmonary/Chest: crackles at bases Abdominal: Soft. Non-tender, non-distended, bowel sounds are normal, no masses, organomegaly, or guarding present.  GU: no CVA tenderness Musculoskeletal: No joint deformities, erythema, or stiffness, ROM full and no nontender Ext: 1+ edema and no cyanosis, pulses palpable bilaterally  (DP and PT) Hematology: no cervical, inginal, or axillary adenopathy.  Neurological: A&O x3, Strength is normal and symmetric bilaterally, cranial nerve II-XII are grossly intact, no focal motor deficit, sensory intact to light touch bilaterally.  Skin: Warm, dry and intact. No rash, cyanosis, or clubbing.  Psychiatric: Normal mood and affect. speech and behavior is normal.   Labs on Admission:  Results for orders placed during the hospital encounter of 01/20/11 (from the past 48 hour(s))  CBC     Status: Abnormal   Collection Time   01/20/11 10:24 AM      Component Value Range Comment   WBC 9.3  4.0 - 10.5 (K/uL)    RBC 3.79 (*) 3.87 - 5.11 (MIL/uL)    Hemoglobin 10.8 (*) 12.0 - 15.0 (g/dL)    HCT 16.1 (*) 09.6 - 46.0 (%)    MCV 94.7  78.0 - 100.0 (fL)  MCH 28.5  26.0 - 34.0 (pg)    MCHC 30.1  30.0 - 36.0 (g/dL)    RDW 04.5 (*) 40.9 - 15.5 (%)    Platelets 253  150 - 400 (K/uL)   BASIC METABOLIC PANEL     Status: Abnormal   Collection Time   01/20/11 10:24 AM      Component Value Range Comment   Sodium 147 (*) 135 - 145 (mEq/L)    Potassium 3.9  3.5 - 5.1 (mEq/L)    Chloride 104  96 - 112 (mEq/L)    CO2 34 (*) 19 - 32 (mEq/L)    Glucose, Bld 126 (*) 70 - 99 (mg/dL)    BUN 27 (*) 6 - 23 (mg/dL)    Creatinine, Ser 8.11 (*) 0.50 - 1.10 (mg/dL)    Calcium 9.8  8.4 - 10.5 (mg/dL)    GFR calc non Af Amer 33 (*) >90 (mL/min)    GFR calc Af Amer 38 (*) >90 (mL/min)   PRO B NATRIURETIC PEPTIDE     Status: Abnormal   Collection Time   01/20/11 10:24 AM      Component Value Range Comment   BNP, POC 26478.0 (*) 0 - 450 (pg/mL)   POCT I-STAT TROPONIN I     Status: Normal   Collection Time   01/20/11 10:37 AM      Component Value Range Comment   Troponin i, poc 0.03  0.00 - 0.08 (ng/mL)    Comment 3            URINALYSIS, ROUTINE W REFLEX MICROSCOPIC     Status: Normal   Collection Time   01/20/11 10:45 AM      Component Value Range Comment   Color, Urine YELLOW  YELLOW      Appearance CLEAR  CLEAR     Specific Gravity, Urine 1.009  1.005 - 1.030     pH 5.0  5.0 - 8.0     Glucose, UA NEGATIVE  NEGATIVE (mg/dL)    Hgb urine dipstick NEGATIVE  NEGATIVE     Bilirubin Urine NEGATIVE  NEGATIVE     Ketones, ur NEGATIVE  NEGATIVE (mg/dL)    Protein, ur NEGATIVE  NEGATIVE (mg/dL)    Urobilinogen, UA 0.2  0.0 - 1.0 (mg/dL)    Nitrite NEGATIVE  NEGATIVE     Leukocytes, UA NEGATIVE  NEGATIVE  MICROSCOPIC NOT DONE ON URINES WITH NEGATIVE PROTEIN, BLOOD, LEUKOCYTES, NITRITE, OR GLUCOSE <1000 mg/dL.    Radiological Exams on Admission: No results found.  Assessment/Plan Principal Problem:  *Systolic CHF, acute on chronic Active Problems:  CKD (chronic kidney disease), stage IV  Respiratory failure, acute-on-chronic  Adult failure to thrive  Plan:  We will admit the patient to a telemetry unit We will place her on CHF protocol, continue with IV lasix for now, as well as continue her ACE and BB She appears to be on adequate medical therapy Daily weights will be checked as well as strict Is and Os.  Patient recently had an echo, so I do not feel the need to repeat it  Clearly with the patient's age and comorbidities, quality of life would be a major concern.  I have discussed this with her son who is her medical power of attorney.  He is agreeable to meet with the palliative care service for goals of care, and hospice eligibility. He agrees with a DNR.  Time Spent on Admission:  Isaiahs Chancy 01/20/2011, 3:40 PM

## 2011-01-20 NOTE — ED Notes (Signed)
Will call unit in 15 minutes to give report.

## 2011-01-20 NOTE — ED Notes (Signed)
Cont. Oxygen @ 2 L

## 2011-01-20 NOTE — ED Notes (Signed)
Gave old and new ECG to Dr. Theodoro Kos after I performed. 10:05 AM JG

## 2011-01-21 LAB — BASIC METABOLIC PANEL
CO2: 35 mEq/L — ABNORMAL HIGH (ref 19–32)
Calcium: 9.7 mg/dL (ref 8.4–10.5)
Creatinine, Ser: 1.32 mg/dL — ABNORMAL HIGH (ref 0.50–1.10)
GFR calc Af Amer: 39 mL/min — ABNORMAL LOW (ref >90)
GFR calc non Af Amer: 34 mL/min — ABNORMAL LOW (ref >90)
Sodium: 148 mEq/L — ABNORMAL HIGH (ref 135–145)

## 2011-01-21 LAB — PRO B NATRIURETIC PEPTIDE: Pro B Natriuretic peptide (BNP): 32069 pg/mL — ABNORMAL HIGH (ref 0–450)

## 2011-01-21 MED ORDER — ENOXAPARIN SODIUM 30 MG/0.3ML ~~LOC~~ SOLN
30.0000 mg | SUBCUTANEOUS | Status: DC
  Administered 2011-01-21 – 2011-01-22 (×2): 30 mg via SUBCUTANEOUS
  Filled 2011-01-21 (×3): qty 0.3

## 2011-01-21 NOTE — Progress Notes (Signed)
Attempted X2 to contact spouse and son at numbers list on chart to schedule goals of care meeting. Will await return call. Will follow up in AM. Thank you for consultation.

## 2011-01-21 NOTE — Progress Notes (Signed)
CSW assessment complete.  FL2 completed for MD signature. Milus Banister MSW,LCSW w/e Coverage [phone]

## 2011-01-21 NOTE — Progress Notes (Signed)
Subjective: No events overnight. Still having some shortness of breath  Objective:  Vital signs in last 24 hours:  Filed Vitals:   01/20/11 1700 01/20/11 2144 01/21/11 0434 01/21/11 0930  BP: 158/82 107/50 142/72 120/67  Pulse: 80 76 73 72  Temp: 98.3 F (36.8 C) 97.7 F (36.5 C) 98.3 F (36.8 C) 97.6 F (36.4 C)  TempSrc: Oral Oral Oral Oral  Resp: 20 20 18 18   Height: 5\' 6"  (1.676 m)     Weight: 53.751 kg (118 lb 8 oz)  51.8 kg (114 lb 3.2 oz)   SpO2: 97% 94% 95% 97%    Intake/Output from previous day:   Intake/Output Summary (Last 24 hours) at 01/21/11 1208 Last data filed at 01/21/11 1000  Gross per 24 hour  Intake    363 ml  Output   1001 ml  Net   -638 ml    Physical Exam: General: Alert, awake, oriented x3, in no acute distress. HEENT: No bruits, no goiter. Heart: Regular rate and rhythm, without murmurs, rubs, gallops. Lungs: crackles at bases Abdomen: Soft, nontender, nondistended, positive bowel sounds. Extremities: No clubbing cyanosis, with positive pedal pulses. 1+edema b/l Neuro: Grossly intact, nonfocal.   Lab Results:  Basic Metabolic Panel:    Component Value Date/Time   NA 148* 01/21/2011 0625   K 4.0 01/21/2011 0625   CL 102 01/21/2011 0625   CO2 35* 01/21/2011 0625   BUN 27* 01/21/2011 0625   CREATININE 1.32* 01/21/2011 0625   GLUCOSE 86 01/21/2011 0625   CALCIUM 9.7 01/21/2011 0625   CBC:    Component Value Date/Time   WBC 7.7 01/20/2011 1600   HGB 11.4* 01/20/2011 1600   HCT 36.6 01/20/2011 1600   PLT 263 01/20/2011 1600   MCV 93.8 01/20/2011 1600   NEUTROABS 5.6 01/15/2011 0500   LYMPHSABS 1.1 01/15/2011 0500   MONOABS 1.0 01/15/2011 0500   EOSABS 0.1 01/15/2011 0500   BASOSABS 0.0 01/15/2011 0500    Recent Results (from the past 240 hour(s))  MRSA PCR SCREENING     Status: Normal   Collection Time   01/20/11  5:02 PM      Component Value Range Status Comment   MRSA by PCR NEGATIVE  NEGATIVE  Final      Studies/Results: Dg Chest Portable 1 View  01/20/2011  *RADIOLOGY REPORT*  Clinical Data: Short of breath  PORTABLE CHEST - 1 VIEW  Comparison: Chest radiograph 01/10/2011  Findings: Stable enlarged heart silhouette.  Bilateral pleural effusion are not changed from prior.  There is mild interstitial edema/pulmonary edema also unchanged.  IMPRESSION:  1.  No interval change.  2.  Cardiomegaly, bilateral pleural effusions and mild pulmonary edema.  Original Report Authenticated By: Genevive Bi, M.D.    Medications: Scheduled Meds:   . aspirin EC  325 mg Oral Daily  . clopidogrel  75 mg Oral Q breakfast  . enoxaparin  40 mg Subcutaneous Q24H  . feeding supplement  237 mL Oral TID  . furosemide  40 mg Intravenous Once  . furosemide  40 mg Intravenous BID  . hydrALAZINE  25 mg Oral Q8H  . levothyroxine  125 mcg Oral Daily  . lisinopril  5 mg Oral Daily  . metoprolol  50 mg Oral BID  . nitroGLYCERIN  0.4 mg Sublingual Once  . pantoprazole  40 mg Oral Q1200  . polyethylene glycol  17 g Oral Daily  . sodium chloride  3 mL Intravenous Q12H   Continuous Infusions:   .  sodium chloride 250 mL (01/20/11 1630)  . DISCONTD: sodium chloride     PRN Meds:.acetaminophen, bisacodyl, ondansetron (ZOFRAN) IV, sodium chloride  Assessment/Plan:  Principal Problem:  *Systolic CHF, acute on chronic Active Problems:  CKD (chronic kidney disease), stage IV  Respiratory failure, acute-on-chronic  Adult failure to thrive  Plan: Appears to be tolerating diuresis, weight is trending down, continue lasix and wean down oxygen Palliative care consult pending for GOC   LOS: 1 day   Ilka Lovick 01/21/2011, 12:08 PM

## 2011-01-22 ENCOUNTER — Inpatient Hospital Stay (HOSPITAL_COMMUNITY): Payer: Medicare Other

## 2011-01-22 LAB — BASIC METABOLIC PANEL
CO2: 35 mEq/L — ABNORMAL HIGH (ref 19–32)
Calcium: 9 mg/dL (ref 8.4–10.5)
Creatinine, Ser: 1.2 mg/dL — ABNORMAL HIGH (ref 0.50–1.10)
GFR calc Af Amer: 44 mL/min — ABNORMAL LOW (ref >90)
GFR calc non Af Amer: 38 mL/min — ABNORMAL LOW (ref >90)
Sodium: 146 mEq/L — ABNORMAL HIGH (ref 135–145)

## 2011-01-22 MED ORDER — MORPHINE SULFATE 10 MG/5ML PO SOLN: 5.0000 mg | ORAL | Status: DC | PRN

## 2011-01-22 NOTE — Progress Notes (Signed)
Subjective: No events overnight.  Breathing is slowly improving.  Objective:  Vital signs in last 24 hours:  Filed Vitals:   01/21/11 1500 01/21/11 2112 01/22/11 0501 01/22/11 1408  BP: 137/76 117/56 140/78 135/78  Pulse: 76 66 75 73  Temp: 97.7 F (36.5 C) 97.8 F (36.6 C) 97.7 F (36.5 C) 98.6 F (37 C)  TempSrc: Oral Oral Oral Oral  Resp: 20 22 20 19   Height:      Weight:   52.7 kg (116 lb 2.9 oz)   SpO2: 96% 95% 98% 97%    Intake/Output from previous day:   Intake/Output Summary (Last 24 hours) at 01/22/11 1847 Last data filed at 01/22/11 1824  Gross per 24 hour  Intake    453 ml  Output    201 ml  Net    252 ml    Physical Exam: General: Alert, awake, in no acute distress. HEENT: No bruits, no goiter. Heart: Regular rate and rhythm, without murmurs, rubs, gallops. Lungs: bronchial breath sounds at bases L>R Abdomen: Soft, nontender, nondistended, positive bowel sounds. Extremities: No clubbing cyanosis, 1+ edema, with positive pedal pulses. Neuro: Grossly intact, nonfocal.   Lab Results:  Basic Metabolic Panel:    Component Value Date/Time   NA 146* 01/22/2011 0642   K 3.4* 01/22/2011 0642   CL 103 01/22/2011 0642   CO2 35* 01/22/2011 0642   BUN 28* 01/22/2011 0642   CREATININE 1.20* 01/22/2011 0642   GLUCOSE 85 01/22/2011 0642   CALCIUM 9.0 01/22/2011 0642   CBC:    Component Value Date/Time   WBC 7.7 01/20/2011 1600   HGB 11.4* 01/20/2011 1600   HCT 36.6 01/20/2011 1600   PLT 263 01/20/2011 1600   MCV 93.8 01/20/2011 1600   NEUTROABS 5.6 01/15/2011 0500   LYMPHSABS 1.1 01/15/2011 0500   MONOABS 1.0 01/15/2011 0500   EOSABS 0.1 01/15/2011 0500   BASOSABS 0.0 01/15/2011 0500    Recent Results (from the past 240 hour(s))  MRSA PCR SCREENING     Status: Normal   Collection Time   01/20/11  5:02 PM      Component Value Range Status Comment   MRSA by PCR NEGATIVE  NEGATIVE  Final     Studies/Results: Dg Chest 2 View  01/22/2011   *RADIOLOGY REPORT*  Clinical Data: Short of breath.  Weakness.  Congestive heart failure.  CHEST - 2 VIEW  Comparison: 01/20/2011  Findings: Moderate to large left pleural effusion and left lower lobe atelectasis or consolidation shows no significant change. Decrease in right lower lobe opacity is seen since previous exam. Heart size is stable.  IMPRESSION:  1.  Decreased opacity in the right lung base. 2.  No significant change in moderate to large left pleural effusion and left lower lobe atelectasis versus consolidation.  Original Report Authenticated By: Danae Orleans, M.D.    Medications: Scheduled Meds:   . aspirin EC  325 mg Oral Daily  . clopidogrel  75 mg Oral Q breakfast  . enoxaparin  30 mg Subcutaneous Q24H  . feeding supplement  237 mL Oral TID  . furosemide  40 mg Intravenous BID  . hydrALAZINE  25 mg Oral Q8H  . levothyroxine  125 mcg Oral Daily  . lisinopril  5 mg Oral Daily  . metoprolol  50 mg Oral BID  . pantoprazole  40 mg Oral Q1200  . polyethylene glycol  17 g Oral Daily  . sodium chloride  3 mL Intravenous Q12H   Continuous  Infusions:   . sodium chloride 250 mL (01/20/11 1630)   PRN Meds:.acetaminophen, bisacodyl, morphine, ondansetron (ZOFRAN) IV, sodium chloride  Assessment/Plan:  Principal Problem:  *Systolic CHF, acute on chronic Active Problems:  CKD (chronic kidney disease), stage IV  Respiratory failure, acute-on-chronic  Adult failure to thrive  Plan:  Appreciate palliative care assistance Plan to return to SNF tomorrow with palliative care to follow as outpt, with goal to be for comfort care.   LOS: 2 days   MEMON,JEHANZEB 01/22/2011, 6:47 PM

## 2011-01-22 NOTE — Progress Notes (Signed)
Patient is from [facility] snf, palliative care consult for goals of care ordered today.  NCM will continue to follow for discharge needs.

## 2011-01-22 NOTE — Progress Notes (Signed)
Follow-up to schedule Palliative Medicine  Consult for goals of care;spoke with patient's son [name] goals of care meeting scheduled for today Monday 01/22/11 @ 4:00 pm  Valente David, RN  Palliative Medicine Team RN Liaison [phone]

## 2011-01-22 NOTE — Consult Note (Signed)
Consult was requested by: Dr Kerry Hough   Consult is for review of medical treatment options, clarification of goals of care and end of life issues, disposition and options, and symptom recommendation.  This NP Lorinda Creed reviewed medical records, received report from team, assessed the patient and then meet at the patient's bedside along with husband Arpi Diebold, son/HPOA Tuleen Mandelbaum [phone] and daughter Barnet Pall by phone  to discuss diagnosis prognosis, GOC, EOL wishes disposition and options.  Problem List: 1.  Acute on chronic CHF 2. Dysphagia 3. Adult Failure to thrive 4. CKD 5. Palliative Performance Scale 40%   Patient Goals/ Recommendations: 1. DNR/DNI 2. No aggressive interventions to treat current disease processes    -no IV drug support    -no thoracentesis  3. No artificial feeding or hydration now or in the future 4. Symptom management     Roxanol/Ativan 5. Discharge back to her home [facility]) ASAP     -left message for Social worker 6.  Palliative medicine to follow on discharge     -comfort feeds and fluids of choice   A detailed discussion was had today regarding advanced directives.  Concepts specific to code status, artifical feeding and hydration, continued IV antibiotics and rehospitalization was had.  The difference between a aggressive medical intervention path  and a palliative comfort care path for this patient at this time was had.  Values and goals of care important to patient and family were attempted to be elicited.  Hard Choices booklet left for review, MOST form completed (original in family possession) PMT will continue to support holistically Placed call and left message for Dr Kerry Hough    Total time spent on the unit was 75 minutes.  Time in 1545- time out 1700. Greater than 50% of the time was spent in counseling and coordination of care. Family encouraged to call with questions and concerns  Lorinda Creed NP   # [phone]

## 2011-01-22 NOTE — Progress Notes (Signed)
UR review completed. 

## 2011-01-23 MED ORDER — FUROSEMIDE 20 MG PO TABS: 40.0000 mg | ORAL_TABLET | Freq: Two times a day (BID) | ORAL | Status: DC

## 2011-01-23 MED ORDER — MORPHINE SULFATE 10 MG/5ML PO SOLN: 5.0000 mg | ORAL | Status: AC | PRN

## 2011-01-23 MED ORDER — LORAZEPAM 0.5 MG PO TABS: 0.5000 mg | ORAL_TABLET | Freq: Three times a day (TID) | ORAL | Status: AC | PRN

## 2011-01-23 NOTE — Discharge Summary (Signed)
Patient ID: [name] MRN: [id number] DOB/AGE: 15-Mar-1916 77 y.o.  Admit date: 01/20/2011 Discharge date: 01/23/2011  Primary Care Physician:  Kimber Relic, MD, MD  Discharge Diagnoses:    .Respiratory failure, acute-on-chronic .Systolic CHF, acute on chronic .CKD (chronic kidney disease), stage IV .Adult failure to thrive Hypernatremia Dysphagia     Current Discharge Medication List    START taking these medications   Details  LORazepam (ATIVAN) 0.5 MG tablet Take 1 tablet (0.5 mg total) by mouth every 8 (eight) hours as needed for anxiety. Qty: 30 tablet, Refills: 0    morphine 10 MG/5ML solution Take 2.5 mLs (5 mg total) by mouth every 3 (three) hours as needed for pain. Qty: 100 mL, Refills: 0      CONTINUE these medications which have CHANGED   Details  furosemide (LASIX) 20 MG tablet Take 2 tablets (40 mg total) by mouth 2 (two) times daily. Qty: 60 tablet, Refills: 0      CONTINUE these medications which have NOT CHANGED   Details  aspirin 325 MG tablet Take 325 mg by mouth daily.      clopidogrel (PLAVIX) 75 MG tablet Take 75 mg by mouth daily.      feeding supplement (ENSURE CLINICAL STRENGTH) LIQD Take 237 mLs by mouth 3 (three) times daily. Qty: 90 Bottle, Refills: 0    levothyroxine (SYNTHROID, LEVOTHROID) 125 MCG tablet Take 125 mcg by mouth daily.      metoprolol (LOPRESSOR) 50 MG tablet Take 1 tablet (50 mg total) by mouth 2 (two) times daily. Qty: 60 tablet, Refills: 0    nitroGLYCERIN (NITROSTAT) 0.4 MG SL tablet Place 0.4 mg under the tongue once.      omeprazole (PRILOSEC) 20 MG capsule Take 20 mg by mouth daily.      polyethylene glycol (MIRALAX) packet Take 17 g by mouth daily.     acetaminophen (TYLENOL) 500 MG tablet Take 1,000 mg by mouth every 6 (six) hours as needed. For pain     bisacodyl (DULCOLAX) 5 MG EC tablet Take 5-10 mg by mouth daily as needed. For constipation      STOP taking these medications     furosemide  (LASIX) 10 MG/ML injection      hydrALAZINE (APRESOLINE) 25 MG tablet      insulin aspart (NOVOLOG) 100 UNIT/ML injection      lisinopril (PRINIVIL,ZESTRIL) 5 MG tablet      aspirin EC 325 MG EC tablet      lisinopril (PRINIVIL,ZESTRIL) 5 MG tablet         Disposition and Follow-up: Follow up with Dr. Chilton Si as needed, Will need Palliative care to follow as an outpatient  Consults:  Palliative Care Services  Significant Diagnostic Studies:  Dg Chest Portable 1 View  01/20/2011  *RADIOLOGY REPORT*  Clinical Data: Short of breath  PORTABLE CHEST - 1 VIEW  Comparison: Chest radiograph 01/10/2011  Findings: Stable enlarged heart silhouette.  Bilateral pleural effusion are not changed from prior.  There is mild interstitial edema/pulmonary edema also unchanged.  IMPRESSION:  1.  No interval change.  2.  Cardiomegaly, bilateral pleural effusions and mild pulmonary edema.  Original Report Authenticated By: Genevive Bi, M.D.    Brief H and P: For complete details please refer to admission H and P, but in brief This is a 77 y/o female who was recently discharged from the hospital after being treated for acute CHF. She resides at [facility], and according to her son, at the  time of discharge, she was looking better than she had in a long time. Patient was in her usual state of health when this morning she woke up from sleep and was unable to breath. She is a poor historian, but relates that she did not have any chest pain, just had a large amount of difficulty breathing. EMS was called and patient was given lasix and started on a bipap. On arrival to the ED she was given more lasix with good effect and was able to be taken off the bipap. Currently she is on 3-4L of oxygen via [place], her baseline is 2L. Her son reports that patient has not been doing well overall since may of this year when she had suffered a fall. Since that time she has spent more of her time in bed, occasionally getting up to  walk when picked up by the nursing staff. Her appetite has been poor. She denies any cough/fever/nausea or lightheadedness.   Physical Exam on Discharge:  Filed Vitals:   01/22/11 1408 01/22/11 2109 01/23/11 0425 01/23/11 1037  BP: 135/78 134/63 110/59 104/56  Pulse: 73 76 70   Temp: 98.6 F (37 C) 97.6 F (36.4 C) 98.7 F (37.1 C)   TempSrc: Oral Oral Oral   Resp: 19 22 20    Height:      Weight:   52 kg (114 lb 10.2 oz)   SpO2: 97% 94% 95%      Intake/Output Summary (Last 24 hours) at 01/23/11 1128 Last data filed at 01/23/11 0954  Gross per 24 hour  Intake    277 ml  Output    951 ml  Net   -674 ml    General: Alert, awake, in no acute distress.  HEENT: No bruits, no goiter.  Heart: Regular rate and rhythm, without murmurs, rubs, gallops.  Lungs: bronchial breath sounds at bases L>R  Abdomen: Soft, nontender, nondistended, positive bowel sounds.  Extremities: No clubbing cyanosis, 1+ edema, with positive pedal pulses.  Neuro: Grossly intact, nonfocal.   CBC:    Component Value Date/Time   WBC 7.7 01/20/2011 1600   HGB 11.4* 01/20/2011 1600   HCT 36.6 01/20/2011 1600   PLT 263 01/20/2011 1600   MCV 93.8 01/20/2011 1600   NEUTROABS 5.6 01/15/2011 0500   LYMPHSABS 1.1 01/15/2011 0500   MONOABS 1.0 01/15/2011 0500   EOSABS 0.1 01/15/2011 0500   BASOSABS 0.0 01/15/2011 0500    Basic Metabolic Panel:    Component Value Date/Time   NA 146* 01/22/2011 0642   K 3.4* 01/22/2011 0642   CL 103 01/22/2011 0642   CO2 35* 01/22/2011 0642   BUN 28* 01/22/2011 0642   CREATININE 1.20* 01/22/2011 0642   GLUCOSE 85 01/22/2011 0642   CALCIUM 9.0 01/22/2011 0642    Hospital Course:    *Systolic CHF, acute on chronic:  Patient was started on IV lasix.  She has diuresed well.  Her oxygen has been titrated down near her baseline level.  She still does appear to have some fluid on board, but her diuresis can be continued as an outpatient.  An extensive conversation was had with  the patient as well as her family.  Goals of care meeting was conducted by the palliative care service. Results of the meeting are as follows:  1. DNR/DNI  2. No aggressive interventions to treat current disease processes  -no IV drug support  -no thoracentesis  3. No artificial feeding or hydration now or in the future  4.  Symptom management  Roxanol/Ativan  5. Discharge back to her home [facility]) ASAP  -left message for Social worker  6. Palliative medicine to follow on discharge  -comfort feeds and fluids of choice  MOST form was filled out with family, and patient does not wish to readmitted to the hospital.  Her [facility]/end [facility] life care.  We will recommend symptom management for her shortness of breath/anxiety.  Palliative care services should follow patient as an outpatient.   CKD (chronic kidney disease), stage IV  Respiratory failure, acute-on-chronic  Adult failure to thrive   Time spent on Discharge:  Signed: MEMON,JEHANZEB 01/23/2011, 11:28 AM

## 2011-01-23 NOTE — Progress Notes (Signed)
Patient stable upon discharge. Patient went to [facility] via the ambulance. No equipment was prescribed. Chart and prescriptions, and discharge instructions sent with the patient to the SNF.

## 2011-01-23 NOTE — Progress Notes (Signed)
Cosign for Gailen Shelter RN assessment, medication administration, and care plan/education.

## 2011-01-23 NOTE — Plan of Care (Signed)
Problem: Discharge Progression Outcomes Goal: If EF < 40% ACEI/ARB addressed at discharge Outcome: Completed/Met Date Met:  01/23/11 35- 40%, on lisinopril and beta blocker

## 2011-01-23 NOTE — Plan of Care (Signed)
Problem: Phase I Progression Outcomes Goal: EF % per last Echo/documented,Core Reminder form on chart Outcome: Completed/Met Date Met:  01/23/11 EF 35-45%

## 2012-02-15 IMAGING — CR DG CHEST 1V PORT
1 series · 1 of 1 positions shown · non-contrast
Comparison: 01/07/2011

CLINICAL DATA: Chest pain.

PORTABLE CHEST - 1 VIEW

[AP]
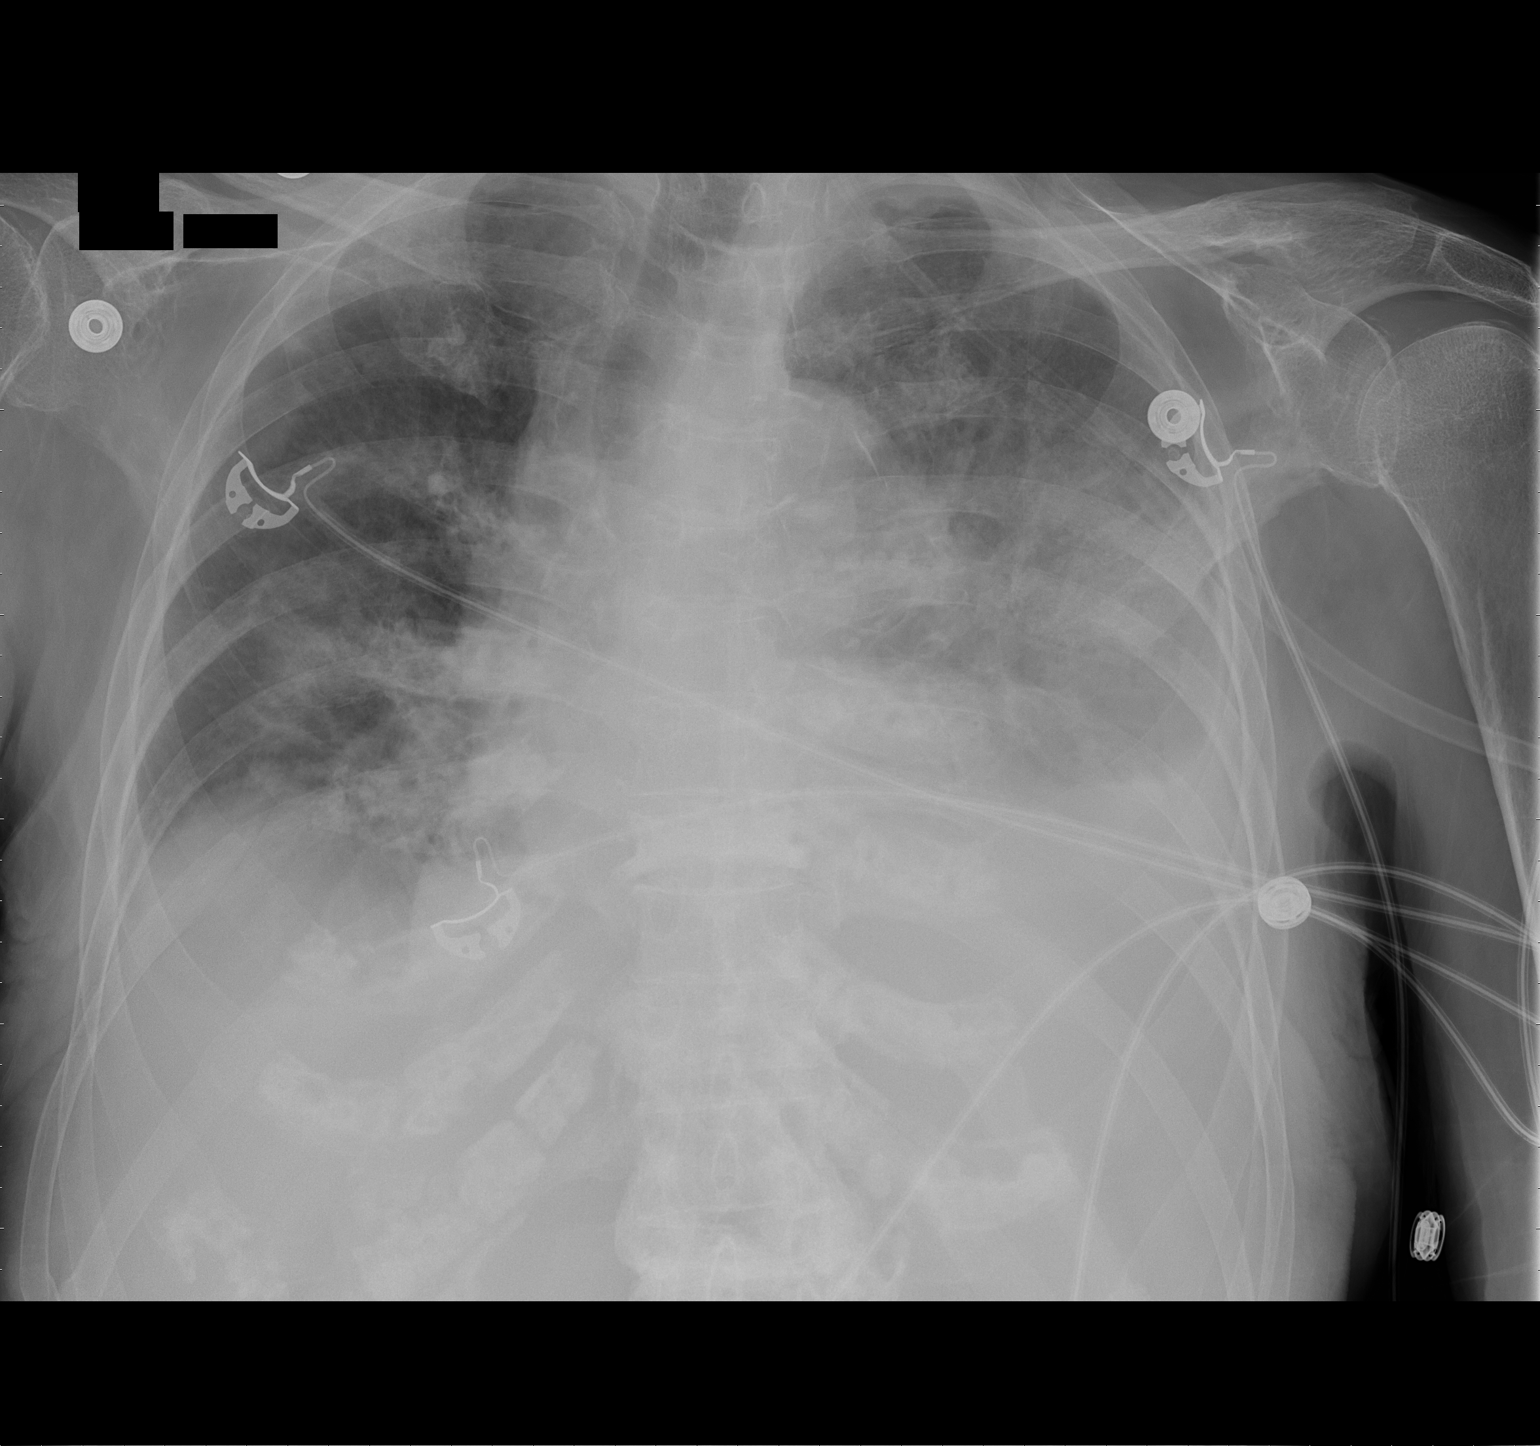

[1 of 1 positions shown; findings below may reference images not displayed]

FINDINGS: Cardiomegaly.  Bilateral airspace disease and effusions,
unchanged.  No acute bony abnormality.
IMPRESSION: No significant change.

## 2012-02-27 IMAGING — CR DG CHEST 2V
1 series · 1 of 1 positions shown · non-contrast
Comparison: 01/20/2011

CLINICAL DATA: Short of breath.  Weakness.  Congestive heart
failure.

CHEST - 2 VIEW

[view not recorded]
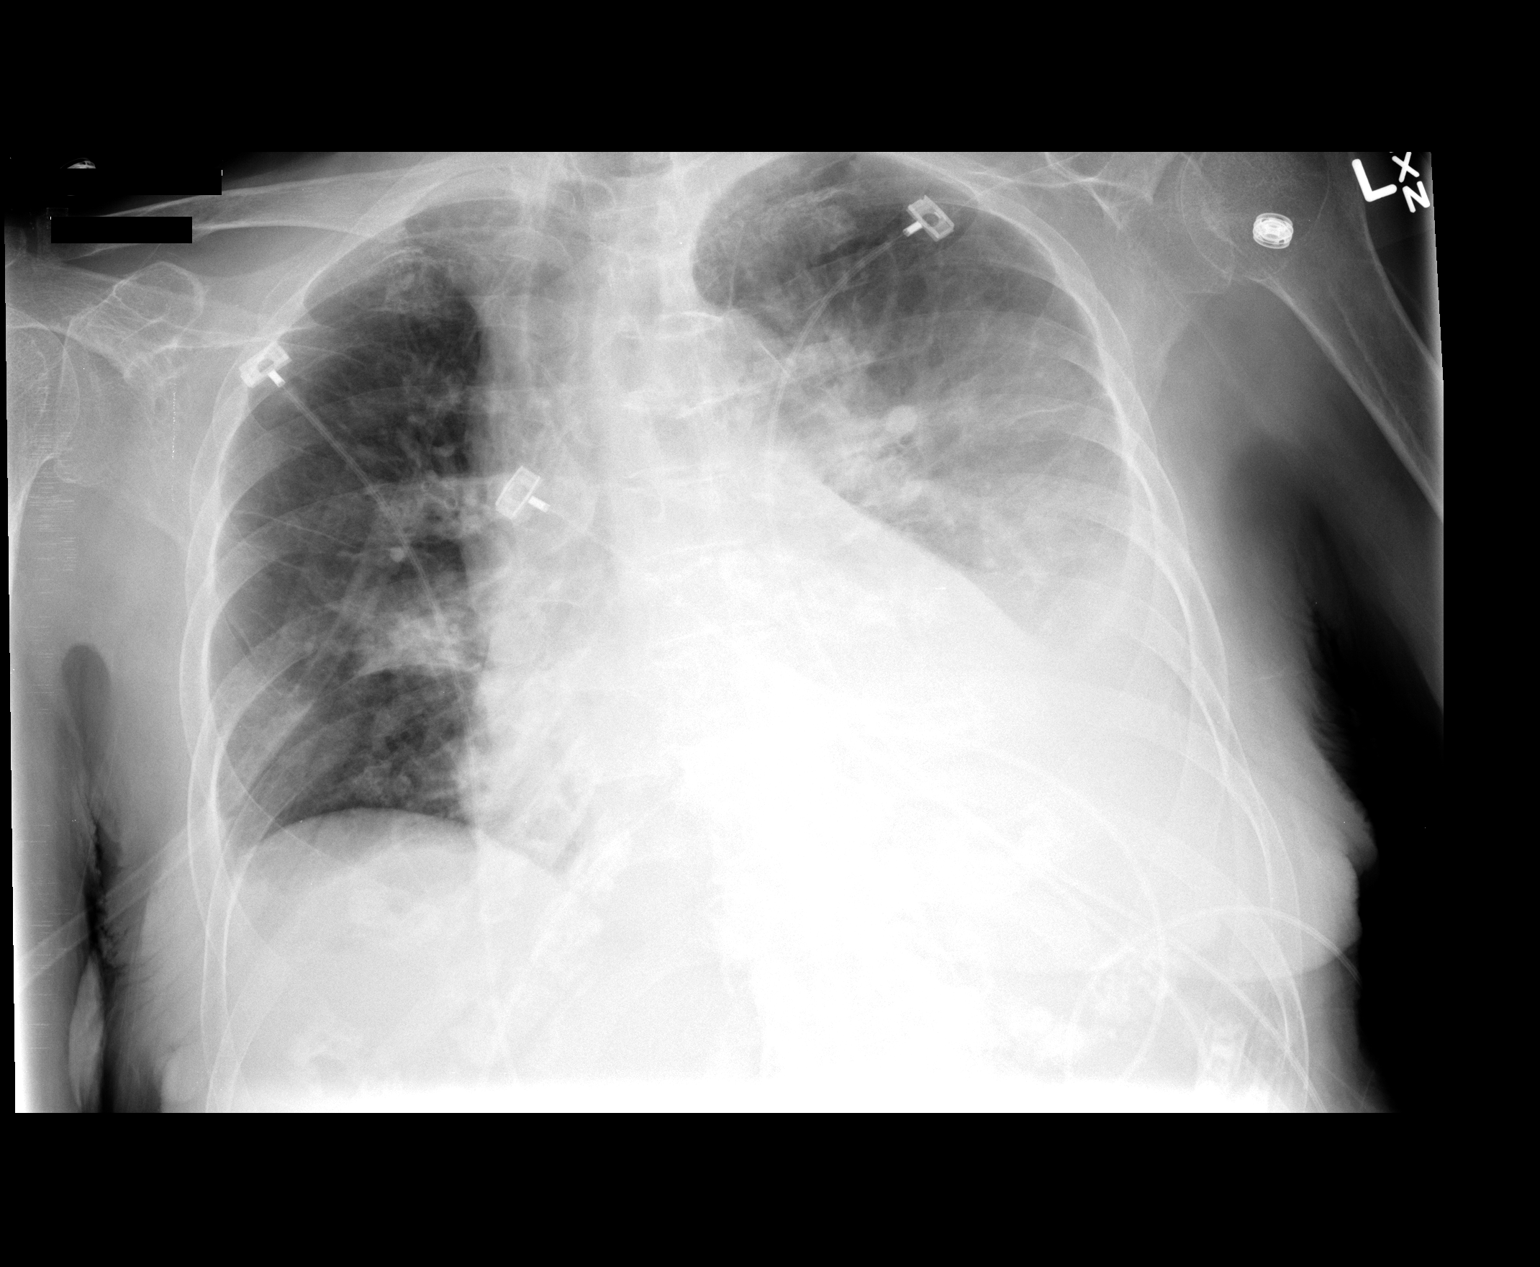

[1 of 1 positions shown; findings below may reference images not displayed]

FINDINGS: Moderate to large left pleural effusion and left lower
lobe atelectasis or consolidation shows no significant change.
Decrease in right lower lobe opacity is seen since previous exam.
Heart size is stable.
IMPRESSION: 1.  Decreased opacity in the right lung base.
2.  No significant change in moderate to large left pleural
effusion and left lower lobe atelectasis versus consolidation.

## 2012-03-22 ENCOUNTER — Inpatient Hospital Stay (HOSPITAL_COMMUNITY)
Admission: EM | Admit: 2012-03-22 | Discharge: 2012-03-27 | DRG: 193 | Disposition: A | Discharge status: Skilled Nursing Facility | Payer: Medicare Other | Attending: Internal Medicine | Admitting: Internal Medicine

## 2012-03-22 DIAGNOSIS — R509 Fever, unspecified: Secondary | ICD-10-CM | POA: Diagnosis present

## 2012-03-22 DIAGNOSIS — A419 Sepsis, unspecified organism: Secondary | ICD-10-CM

## 2012-03-22 DIAGNOSIS — I10 Essential (primary) hypertension: Secondary | ICD-10-CM | POA: Diagnosis present

## 2012-03-22 DIAGNOSIS — J9690 Respiratory failure, unspecified, unspecified whether with hypoxia or hypercapnia: Secondary | ICD-10-CM

## 2012-03-22 DIAGNOSIS — I5021 Acute systolic (congestive) heart failure: Secondary | ICD-10-CM

## 2012-03-22 DIAGNOSIS — F3289 Other specified depressive episodes: Secondary | ICD-10-CM | POA: Diagnosis present

## 2012-03-22 DIAGNOSIS — R5383 Other fatigue: Secondary | ICD-10-CM | POA: Diagnosis present

## 2012-03-22 DIAGNOSIS — Z85828 Personal history of other malignant neoplasm of skin: Secondary | ICD-10-CM

## 2012-03-22 DIAGNOSIS — I5020 Unspecified systolic (congestive) heart failure: Secondary | ICD-10-CM | POA: Diagnosis present

## 2012-03-22 DIAGNOSIS — Z7982 Long term (current) use of aspirin: Secondary | ICD-10-CM

## 2012-03-22 DIAGNOSIS — I428 Other cardiomyopathies: Secondary | ICD-10-CM | POA: Diagnosis present

## 2012-03-22 DIAGNOSIS — N179 Acute kidney failure, unspecified: Secondary | ICD-10-CM

## 2012-03-22 DIAGNOSIS — E440 Moderate protein-calorie malnutrition: Secondary | ICD-10-CM

## 2012-03-22 DIAGNOSIS — J9600 Acute respiratory failure, unspecified whether with hypoxia or hypercapnia: Secondary | ICD-10-CM | POA: Diagnosis not present

## 2012-03-22 DIAGNOSIS — J9620 Acute and chronic respiratory failure, unspecified whether with hypoxia or hypercapnia: Secondary | ICD-10-CM

## 2012-03-22 DIAGNOSIS — I252 Old myocardial infarction: Secondary | ICD-10-CM

## 2012-03-22 DIAGNOSIS — Z96649 Presence of unspecified artificial hip joint: Secondary | ICD-10-CM

## 2012-03-22 DIAGNOSIS — R5381 Other malaise: Secondary | ICD-10-CM | POA: Diagnosis present

## 2012-03-22 DIAGNOSIS — E039 Hypothyroidism, unspecified: Secondary | ICD-10-CM | POA: Diagnosis present

## 2012-03-22 DIAGNOSIS — I509 Heart failure, unspecified: Secondary | ICD-10-CM | POA: Diagnosis present

## 2012-03-22 DIAGNOSIS — Z66 Do not resuscitate: Secondary | ICD-10-CM | POA: Diagnosis present

## 2012-03-22 DIAGNOSIS — I129 Hypertensive chronic kidney disease with stage 1 through stage 4 chronic kidney disease, or unspecified chronic kidney disease: Secondary | ICD-10-CM | POA: Diagnosis present

## 2012-03-22 DIAGNOSIS — F329 Major depressive disorder, single episode, unspecified: Secondary | ICD-10-CM | POA: Diagnosis present

## 2012-03-22 DIAGNOSIS — A498 Other bacterial infections of unspecified site: Secondary | ICD-10-CM

## 2012-03-22 DIAGNOSIS — I4891 Unspecified atrial fibrillation: Secondary | ICD-10-CM | POA: Diagnosis present

## 2012-03-22 DIAGNOSIS — E46 Unspecified protein-calorie malnutrition: Secondary | ICD-10-CM | POA: Diagnosis present

## 2012-03-22 DIAGNOSIS — R0902 Hypoxemia: Secondary | ICD-10-CM | POA: Diagnosis present

## 2012-03-22 DIAGNOSIS — I5023 Acute on chronic systolic (congestive) heart failure: Secondary | ICD-10-CM

## 2012-03-22 DIAGNOSIS — I480 Paroxysmal atrial fibrillation: Secondary | ICD-10-CM

## 2012-03-22 DIAGNOSIS — R739 Hyperglycemia, unspecified: Secondary | ICD-10-CM | POA: Diagnosis present

## 2012-03-22 DIAGNOSIS — R7309 Other abnormal glucose: Secondary | ICD-10-CM | POA: Diagnosis not present

## 2012-03-22 DIAGNOSIS — IMO0002 Reserved for concepts with insufficient information to code with codable children: Secondary | ICD-10-CM

## 2012-03-22 DIAGNOSIS — R627 Adult failure to thrive: Secondary | ICD-10-CM

## 2012-03-22 DIAGNOSIS — I214 Non-ST elevation (NSTEMI) myocardial infarction: Secondary | ICD-10-CM

## 2012-03-22 DIAGNOSIS — D649 Anemia, unspecified: Secondary | ICD-10-CM

## 2012-03-22 DIAGNOSIS — Z8781 Personal history of (healed) traumatic fracture: Secondary | ICD-10-CM

## 2012-03-22 DIAGNOSIS — J189 Pneumonia, unspecified organism: Principal | ICD-10-CM | POA: Diagnosis present

## 2012-03-22 DIAGNOSIS — N184 Chronic kidney disease, stage 4 (severe): Secondary | ICD-10-CM | POA: Diagnosis present

## 2012-03-22 DIAGNOSIS — N390 Urinary tract infection, site not specified: Secondary | ICD-10-CM

## 2012-03-22 DIAGNOSIS — R1310 Dysphagia, unspecified: Secondary | ICD-10-CM | POA: Diagnosis present

## 2012-03-22 NOTE — ED Notes (Signed)
[facility] present to ED with 79 yo female from [facility] nursing facility with difficulty breathing and fever for 1 1/2 weeks originally diagnosed with URI.  Pt was last given Rocephin and 2 breathing treatments, last one given by nursing home facility staff at 2130 with no relief; EMS gave Albuterol 5 mg initially then 125 mg solumedrol followed by another round of Albuterol 5 mg and Atrovent 0.5 mg with very slight improvement and decreased respiratory effort.  Pt went from 87% on room air to 93% on 4 liters upon arrival to ED.

## 2012-03-23 ENCOUNTER — Encounter (HOSPITAL_COMMUNITY): Payer: Self-pay | Admitting: Emergency Medicine

## 2012-03-23 ENCOUNTER — Emergency Department (HOSPITAL_COMMUNITY): Payer: Medicare Other

## 2012-03-23 DIAGNOSIS — R0902 Hypoxemia: Secondary | ICD-10-CM | POA: Diagnosis present

## 2012-03-23 DIAGNOSIS — J189 Pneumonia, unspecified organism: Secondary | ICD-10-CM | POA: Diagnosis present

## 2012-03-23 DIAGNOSIS — R509 Fever, unspecified: Secondary | ICD-10-CM | POA: Diagnosis present

## 2012-03-23 DIAGNOSIS — F329 Major depressive disorder, single episode, unspecified: Secondary | ICD-10-CM

## 2012-03-23 DIAGNOSIS — I5021 Acute systolic (congestive) heart failure: Secondary | ICD-10-CM

## 2012-03-23 DIAGNOSIS — N179 Acute kidney failure, unspecified: Secondary | ICD-10-CM

## 2012-03-23 DIAGNOSIS — I5020 Unspecified systolic (congestive) heart failure: Secondary | ICD-10-CM | POA: Diagnosis present

## 2012-03-23 DIAGNOSIS — F3289 Other specified depressive episodes: Secondary | ICD-10-CM

## 2012-03-23 LAB — CBC WITH DIFFERENTIAL/PLATELET
Basophils Absolute: 0 10*3/uL (ref 0.0–0.1)
Eosinophils Relative: 1 % (ref 0–5)
Lymphocytes Relative: 8 % — ABNORMAL LOW (ref 12–46)
Lymphs Abs: 0.9 10*3/uL (ref 0.7–4.0)
MCV: 97.4 fL (ref 78.0–100.0)
Neutro Abs: 9.5 10*3/uL — ABNORMAL HIGH (ref 1.7–7.7)
Neutrophils Relative %: 81 % — ABNORMAL HIGH (ref 43–77)
Platelets: 241 10*3/uL (ref 150–400)
RBC: 3.46 MIL/uL — ABNORMAL LOW (ref 3.87–5.11)
RDW: 15.9 % — ABNORMAL HIGH (ref 11.5–15.5)
WBC: 11.7 10*3/uL — ABNORMAL HIGH (ref 4.0–10.5)

## 2012-03-23 LAB — BASIC METABOLIC PANEL
BUN: 54 mg/dL — ABNORMAL HIGH (ref 6–23)
CO2: 28 mEq/L (ref 19–32)
CO2: 26 mEq/L (ref 19–32)
Calcium: 8.8 mg/dL (ref 8.4–10.5)
GFR calc non Af Amer: 24 mL/min — ABNORMAL LOW (ref >90)
Glucose, Bld: 158 mg/dL — ABNORMAL HIGH (ref 70–99)
Glucose, Bld: 200 mg/dL — ABNORMAL HIGH (ref 70–99)
Potassium: 4.3 mEq/L (ref 3.5–5.1)
Potassium: 4.3 mEq/L (ref 3.5–5.1)
Sodium: 143 mEq/L (ref 135–145)
Sodium: 142 mEq/L (ref 135–145)

## 2012-03-23 LAB — POCT I-STAT, CHEM 8
Chloride: 104 mEq/L (ref 96–112)
Creatinine, Ser: 2.1 mg/dL — ABNORMAL HIGH (ref 0.50–1.10)
HCT: 33 % — ABNORMAL LOW (ref 36.0–46.0)
Hemoglobin: 11.2 g/dL — ABNORMAL LOW (ref 12.0–15.0)
Potassium: 4.2 mEq/L (ref 3.5–5.1)
Sodium: 144 mEq/L (ref 135–145)

## 2012-03-23 LAB — CBC
Hemoglobin: 10.7 g/dL — ABNORMAL LOW (ref 12.0–15.0)
MCH: 30.8 pg (ref 26.0–34.0)
MCHC: 31.5 g/dL (ref 30.0–36.0)
MCV: 98 fL (ref 78.0–100.0)
RBC: 3.47 MIL/uL — ABNORMAL LOW (ref 3.87–5.11)

## 2012-03-23 LAB — MRSA PCR SCREENING: MRSA by PCR: POSITIVE — AB

## 2012-03-23 MED ORDER — VANCOMYCIN HCL 500 MG IV SOLR
500.0000 mg | INTRAVENOUS | Status: DC
  Administered 2012-03-24: 500 mg via INTRAVENOUS
  Filled 2012-03-23 (×2): qty 500

## 2012-03-23 MED ORDER — FUROSEMIDE 40 MG PO TABS
40.0000 mg | ORAL_TABLET | Freq: Two times a day (BID) | ORAL | Status: DC
  Administered 2012-03-23 – 2012-03-24 (×2): 40 mg via ORAL
  Filled 2012-03-23 (×4): qty 1

## 2012-03-23 MED ORDER — METOPROLOL TARTRATE 25 MG PO TABS
37.5000 mg | ORAL_TABLET | Freq: Two times a day (BID) | ORAL | Status: DC
  Administered 2012-03-23 – 2012-03-26 (×6): 37.5 mg via ORAL
  Filled 2012-03-23 (×7): qty 1

## 2012-03-23 MED ORDER — ALBUTEROL SULFATE (5 MG/ML) 0.5% IN NEBU
2.5000 mg | INHALATION_SOLUTION | RESPIRATORY_TRACT | Status: DC | PRN
  Filled 2012-03-23: qty 0.5

## 2012-03-23 MED ORDER — OXYCODONE HCL 5 MG PO TABS: 5.0000 mg | ORAL_TABLET | ORAL | Status: DC | PRN

## 2012-03-23 MED ORDER — CHOLECALCIFEROL 25 MCG (1000 UT) PO TABS
1000.0000 [IU] | ORAL_TABLET | Freq: Two times a day (BID) | ORAL | Status: DC
  Administered 2012-03-23 – 2012-03-27 (×8): 1000 [IU] via ORAL
  Filled 2012-03-23 (×9): qty 1

## 2012-03-23 MED ORDER — DEXTROSE 5 % IV SOLN
1.0000 g | INTRAVENOUS | Status: DC
  Administered 2012-03-23 – 2012-03-26 (×4): 1 g via INTRAVENOUS
  Filled 2012-03-23 (×4): qty 1

## 2012-03-23 MED ORDER — ALBUTEROL SULFATE (5 MG/ML) 0.5% IN NEBU
2.5000 mg | INHALATION_SOLUTION | Freq: Four times a day (QID) | RESPIRATORY_TRACT | Status: DC
  Administered 2012-03-23 (×2): 2.5 mg via RESPIRATORY_TRACT
  Filled 2012-03-23: qty 0.5

## 2012-03-23 MED ORDER — GUAIFENESIN ER 600 MG PO TB12
1200.0000 mg | ORAL_TABLET | Freq: Two times a day (BID) | ORAL | Status: DC | PRN
  Administered 2012-03-23: 1200 mg via ORAL
  Filled 2012-03-23: qty 2

## 2012-03-23 MED ORDER — TIMOLOL MALEATE 0.5 % OP SOLN
1.0000 [drp] | Freq: Two times a day (BID) | OPHTHALMIC | Status: DC
  Administered 2012-03-23 – 2012-03-27 (×9): 1 [drp] via OPHTHALMIC
  Filled 2012-03-23: qty 5

## 2012-03-23 MED ORDER — BRIMONIDINE TARTRATE-TIMOLOL 0.2-0.5 % OP SOLN
1.0000 [drp] | Freq: Two times a day (BID) | OPHTHALMIC | Status: DC
  Filled 2012-03-23 (×7): qty 0.1

## 2012-03-23 MED ORDER — ONDANSETRON HCL 4 MG/2ML IJ SOLN: 4.0000 mg | Freq: Three times a day (TID) | INTRAMUSCULAR | Status: DC | PRN

## 2012-03-23 MED ORDER — ACETAMINOPHEN 650 MG RE SUPP: 650.0000 mg | Freq: Four times a day (QID) | RECTAL | Status: DC | PRN

## 2012-03-23 MED ORDER — LEVOTHYROXINE SODIUM 100 MCG PO TABS
100.0000 ug | ORAL_TABLET | Freq: Every day | ORAL | Status: DC
  Administered 2012-03-24 – 2012-03-27 (×4): 100 ug via ORAL
  Filled 2012-03-23 (×5): qty 1

## 2012-03-23 MED ORDER — MUPIROCIN 2 % EX OINT
1.0000 "application " | TOPICAL_OINTMENT | Freq: Two times a day (BID) | CUTANEOUS | Status: DC
  Administered 2012-03-23 – 2012-03-27 (×9): 1 via NASAL
  Filled 2012-03-23: qty 22

## 2012-03-23 MED ORDER — SODIUM CHLORIDE 0.9 % IV BOLUS (SEPSIS)
500.0000 mL | Freq: Once | INTRAVENOUS | Status: AC
  Administered 2012-03-23: 500 mL via INTRAVENOUS

## 2012-03-23 MED ORDER — ENOXAPARIN SODIUM 30 MG/0.3ML ~~LOC~~ SOLN
30.0000 mg | Freq: Every day | SUBCUTANEOUS | Status: DC
  Administered 2012-03-23 – 2012-03-27 (×5): 30 mg via SUBCUTANEOUS
  Filled 2012-03-23 (×5): qty 0.3

## 2012-03-23 MED ORDER — LEVOFLOXACIN IN D5W 250 MG/50ML IV SOLN
250.0000 mg | INTRAVENOUS | Status: DC
  Administered 2012-03-24: 250 mg via INTRAVENOUS
  Filled 2012-03-23 (×2): qty 50

## 2012-03-23 MED ORDER — METOPROLOL TARTRATE 50 MG PO TABS
50.0000 mg | ORAL_TABLET | Freq: Two times a day (BID) | ORAL | Status: DC
  Filled 2012-03-23: qty 1

## 2012-03-23 MED ORDER — ASPIRIN EC 81 MG PO TBEC
81.0000 mg | DELAYED_RELEASE_TABLET | Freq: Every day | ORAL | Status: DC
  Administered 2012-03-23 – 2012-03-27 (×5): 81 mg via ORAL
  Filled 2012-03-23 (×5): qty 1

## 2012-03-23 MED ORDER — BRIMONIDINE TARTRATE-TIMOLOL 0.2-0.5 % OP SOLN
1.0000 [drp] | Freq: Two times a day (BID) | OPHTHALMIC | Status: DC
  Filled 2012-03-23 (×7): qty 1

## 2012-03-23 MED ORDER — BISACODYL 5 MG PO TBEC: 5.0000 mg | DELAYED_RELEASE_TABLET | Freq: Every day | ORAL | Status: DC | PRN

## 2012-03-23 MED ORDER — ONDANSETRON HCL 4 MG PO TABS: 4.0000 mg | ORAL_TABLET | Freq: Four times a day (QID) | ORAL | Status: DC | PRN

## 2012-03-23 MED ORDER — BRIMONIDINE TARTRATE-TIMOLOL 0.2-0.5 % OP SOLN
1.0000 [drp] | Freq: Two times a day (BID) | OPHTHALMIC | Status: DC
  Filled 2012-03-23: qty 5

## 2012-03-23 MED ORDER — ZOLPIDEM TARTRATE 5 MG PO TABS: 5.0000 mg | ORAL_TABLET | Freq: Every evening | ORAL | Status: DC | PRN

## 2012-03-23 MED ORDER — SACCHAROMYCES BOULARDII 250 MG PO CAPS
250.0000 mg | ORAL_CAPSULE | Freq: Two times a day (BID) | ORAL | Status: DC
  Administered 2012-03-23 – 2012-03-27 (×8): 250 mg via ORAL
  Filled 2012-03-23 (×9): qty 1

## 2012-03-23 MED ORDER — NITROGLYCERIN 0.4 MG SL SUBL: 0.4000 mg | SUBLINGUAL_TABLET | SUBLINGUAL | Status: DC | PRN

## 2012-03-23 MED ORDER — CHLORHEXIDINE GLUCONATE CLOTH 2 % EX PADS
6.0000 | MEDICATED_PAD | Freq: Every day | CUTANEOUS | Status: DC
  Administered 2012-03-24 – 2012-03-27 (×4): 6 via TOPICAL

## 2012-03-23 MED ORDER — BRIMONIDINE TARTRATE-TIMOLOL 0.2-0.5 % OP SOLN
1.0000 [drp] | Freq: Two times a day (BID) | OPHTHALMIC | Status: DC
  Filled 2012-03-23 (×7): qty 5

## 2012-03-23 MED ORDER — NITROGLYCERIN 0.4 MG SL SUBL: 0.4000 mg | SUBLINGUAL_TABLET | Freq: Once | SUBLINGUAL | Status: DC

## 2012-03-23 MED ORDER — CLOPIDOGREL BISULFATE 75 MG PO TABS
75.0000 mg | ORAL_TABLET | Freq: Every day | ORAL | Status: DC
  Administered 2012-03-24 – 2012-03-27 (×4): 75 mg via ORAL
  Filled 2012-03-23 (×5): qty 1

## 2012-03-23 MED ORDER — ONDANSETRON HCL 4 MG/2ML IJ SOLN: 4.0000 mg | Freq: Four times a day (QID) | INTRAMUSCULAR | Status: DC | PRN

## 2012-03-23 MED ORDER — METHYLPREDNISOLONE SODIUM SUCC 40 MG IJ SOLR
40.0000 mg | Freq: Three times a day (TID) | INTRAMUSCULAR | Status: DC
  Administered 2012-03-23 – 2012-03-26 (×9): 40 mg via INTRAVENOUS
  Filled 2012-03-23 (×12): qty 1

## 2012-03-23 MED ORDER — ALUM & MAG HYDROXIDE-SIMETH 200-200-20 MG/5ML PO SUSP: 30.0000 mL | Freq: Four times a day (QID) | ORAL | Status: DC | PRN

## 2012-03-23 MED ORDER — ACETAMINOPHEN 325 MG PO TABS: 650.0000 mg | ORAL_TABLET | Freq: Four times a day (QID) | ORAL | Status: DC | PRN

## 2012-03-23 MED ORDER — ALBUTEROL SULFATE (5 MG/ML) 0.5% IN NEBU
2.5000 mg | INHALATION_SOLUTION | Freq: Four times a day (QID) | RESPIRATORY_TRACT | Status: DC
  Administered 2012-03-23 – 2012-03-26 (×13): 2.5 mg via RESPIRATORY_TRACT
  Filled 2012-03-23 (×13): qty 0.5

## 2012-03-23 MED ORDER — SENNOSIDES-DOCUSATE SODIUM 8.6-50 MG PO TABS
1.0000 | ORAL_TABLET | Freq: Two times a day (BID) | ORAL | Status: DC
  Administered 2012-03-23 – 2012-03-27 (×8): 1 via ORAL
  Filled 2012-03-23 (×9): qty 1

## 2012-03-23 MED ORDER — HYDROMORPHONE HCL PF 1 MG/ML IJ SOLN: 0.5000 mg | INTRAMUSCULAR | Status: DC | PRN

## 2012-03-23 MED ORDER — SODIUM CHLORIDE 0.9 % IJ SOLN
3.0000 mL | Freq: Two times a day (BID) | INTRAMUSCULAR | Status: DC
  Administered 2012-03-23 – 2012-03-27 (×8): 3 mL via INTRAVENOUS

## 2012-03-23 MED ORDER — LEVOFLOXACIN IN D5W 500 MG/100ML IV SOLN: 500.0000 mg | INTRAVENOUS | Status: DC

## 2012-03-23 MED ORDER — PANTOPRAZOLE SODIUM 40 MG PO TBEC
40.0000 mg | DELAYED_RELEASE_TABLET | Freq: Every day | ORAL | Status: DC
  Administered 2012-03-23 – 2012-03-27 (×5): 40 mg via ORAL
  Filled 2012-03-23 (×5): qty 1

## 2012-03-23 MED ORDER — VANCOMYCIN HCL IN DEXTROSE 1-5 GM/200ML-% IV SOLN
1000.0000 mg | Freq: Once | INTRAVENOUS | Status: AC
  Administered 2012-03-23: 1000 mg via INTRAVENOUS
  Filled 2012-03-23: qty 200

## 2012-03-23 MED ORDER — SODIUM CHLORIDE 0.9 % IV SOLN
INTRAVENOUS | Status: AC
  Administered 2012-03-23: 13:00:00 via INTRAVENOUS

## 2012-03-23 MED ORDER — LATANOPROST 0.005 % OP SOLN
1.0000 [drp] | Freq: Every day | OPHTHALMIC | Status: DC
  Administered 2012-03-23 – 2012-03-26 (×4): 1 [drp] via OPHTHALMIC
  Filled 2012-03-23: qty 2.5

## 2012-03-23 MED ORDER — LEVOFLOXACIN IN D5W 500 MG/100ML IV SOLN
500.0000 mg | Freq: Once | INTRAVENOUS | Status: AC
  Administered 2012-03-23: 500 mg via INTRAVENOUS
  Filled 2012-03-23: qty 100

## 2012-03-23 MED ORDER — IPRATROPIUM BROMIDE 0.02 % IN SOLN
0.5000 mg | Freq: Four times a day (QID) | RESPIRATORY_TRACT | Status: DC
  Administered 2012-03-23 – 2012-03-26 (×13): 0.5 mg via RESPIRATORY_TRACT
  Filled 2012-03-23 (×13): qty 2.5

## 2012-03-23 MED ORDER — BRIMONIDINE TARTRATE 0.2 % OP SOLN
1.0000 [drp] | Freq: Two times a day (BID) | OPHTHALMIC | Status: DC
  Administered 2012-03-23 – 2012-03-27 (×9): 1 [drp] via OPHTHALMIC
  Filled 2012-03-23: qty 5

## 2012-03-23 MED ORDER — SODIUM CHLORIDE 0.9 % IV SOLN
INTRAVENOUS | Status: DC
  Administered 2012-03-23: 04:00:00 via INTRAVENOUS

## 2012-03-23 NOTE — Progress Notes (Signed)
Patient arrived to unit from ED via stretcher. Patient oriented to unit. Fall and safety plan reviewed with both patient and son beside. Call light within reach with bed alarm and camera on. Will continue to monitor. [name]

## 2012-03-23 NOTE — Progress Notes (Signed)
CRITICAL VALUE ALERT  Critical value received:  + mrsa swab  Date of notification:  03/23/12 Time of notification:  0555 Critical value read back:yes Nurse who received alert:  Nur Krasinski MD notified (1st page):   Time of first page:   MD notified (2nd page): Time of second page: Responding MD:   Time MD responded:

## 2012-03-23 NOTE — H&P (Signed)
[facility] History and Physical  KAZUKO CLEMENCE OZH:[id number] DOB: [birth year] DOA: 03/22/2012  Referring physician:  PCP: Kimber Relic, MD  Specialists:   Chief Complaint: Fevers, Cough SOB  HPI: [name] is a 79 y.o. female resident of [facility] who was sent to the ED due to complaints of worsening SOB, cough and fevers.  She reports that she has been sick for 2 weeks.  She was treated with Avelox, and Rocephin at the nursing home but her symptoms continued to worsen.   She complained of SOB in the Evening and was found to be hypoxic, she was given a breathing treatment without relief and EMS was called and transported her to the ED.  Her  Oxygen saturations were 87%.   She received another neb treatment and solumedrol and was placed on supplemental O2 on route to the hospital.      Review of Systems: The patient denies anorexia, weight loss, vision loss, decreased hearing, hoarseness, chest pain, syncope, dyspnea on exertion, peripheral edema, balance deficits, hemoptysis, abdominal pain, nausea, vomiting, diarrhea, constipation, hematemesis, melena, hematochezia, severe indigestion/heartburn, hematuria, incontinence, urinary retention, dysuria, polyuria, muscle weakness, suspicious skin lesions, transient blindness, difficulty walking, depression, unusual weight change, abnormal bleeding, enlarged lymph nodes, angioedema, and breast masses.    Past Medical History  Diagnosis Date  . Hypothyroidism   . Depression   . Migraines   . Dental pulp degeneration   . Corns and callus   . Gait disorder   . Urinary incontinence   . Skin cancer   . Cataract   . Skin cancer   . Insomnia    Past Surgical History  Procedure Date  . Total hip arthroplasty   . Cataract extraction   . Abdominal hysterectomy      Medications:  HOME MEDS: Prior to Admission medications   Medication Sig Start Date End Date Taking? Authorizing Provider  acetaminophen (TYLENOL) 325 MG tablet  Take 650 mg by mouth every 6 (six) hours as needed. pain   Yes Historical Provider, MD  albuterol (PROVENTIL) (5 MG/ML) 0.5% nebulizer solution Take 2.5 mg by nebulization every 4 (four) hours as needed. wheezing   Yes Historical Provider, MD  aspirin EC 81 MG tablet Take 81 mg by mouth daily.   Yes Historical Provider, MD  brimonidine-timolol (COMBIGAN) 0.2-0.5 % ophthalmic solution Place 1 drop into both eyes every 12 (twelve) hours.   Yes Historical Provider, MD  Cholecalciferol 1000 UNITS tablet Take 1,000 Units by mouth 2 (two) times daily.   Yes Historical Provider, MD  clopidogrel (PLAVIX) 75 MG tablet Take 75 mg by mouth daily.     Yes Historical Provider, MD  dextrose 5 % SOLN 50 mL with cefTRIAXone 1 G SOLR 1 g Inject 1 g into the vein daily. For 5 days beginning 03/22/12 Upper respiratory infection   Yes Historical Provider, MD  furosemide (LASIX) 20 MG tablet Take 2 tablets (40 mg total) by mouth 2 (two) times daily. 01/23/11  Yes Erick Blinks, MD  guaiFENesin (MUCINEX) 600 MG 12 hr tablet Take 1,200 mg by mouth 2 (two) times daily as needed. congestion   Yes Historical Provider, MD  Ipratropium-Albuterol (DUONEB IN) Inhale 1 ampule into the lungs every 6 (six) hours as needed. wheezing   Yes Historical Provider, MD  latanoprost (XALATAN) 0.005 % ophthalmic solution Place 1 drop into both eyes at bedtime.   Yes Historical Provider, MD  levothyroxine (SYNTHROID, LEVOTHROID) 100 MCG tablet Take 100 mcg by mouth  daily.   Yes Historical Provider, MD  metoprolol (LOPRESSOR) 50 MG tablet Take 50 mg by mouth 2 (two) times daily.   Yes Historical Provider, MD  moxifloxacin (AVELOX) 400 MG tablet Take 400 mg by mouth daily.   Yes Historical Provider, MD  Multiple Vitamins-Minerals (PRESERVISION AREDS 2 PO) Take 1 capsule by mouth 2 (two) times daily.   Yes Historical Provider, MD  pantoprazole (PROTONIX) 40 MG tablet Take 40 mg by mouth daily.   Yes Historical Provider, MD  saccharomyces  boulardii (FLORASTOR) 250 MG capsule Take 250 mg by mouth 2 (two) times daily.   Yes Historical Provider, MD  senna-docusate (SENOKOT-S) 8.6-50 MG per tablet Take 1 tablet by mouth 2 (two) times daily.   Yes Historical Provider, MD  acetaminophen (TYLENOL) 500 MG tablet Take 1,000 mg by mouth every 6 (six) hours as needed. For pain     Historical Provider, MD  bisacodyl (DULCOLAX) 5 MG EC tablet Take 5-10 mg by mouth daily as needed. For constipation    Historical Provider, MD  metoprolol (LOPRESSOR) 50 MG tablet Take 1 tablet (50 mg total) by mouth 2 (two) times daily. 01/16/11 01/16/12  Altha Harm, MD  nitroGLYCERIN (NITROSTAT) 0.4 MG SL tablet Place 0.4 mg under the tongue once.      Historical Provider, MD    Allergies:  Allergies  Allergen Reactions  . Anaprox (Naproxen Sodium)     Unknown reaction    Social History: resident of [facility]  reports that she has never smoked. She has never used smokeless tobacco. She reports that she does not drink alcohol or use illicit drugs.  Family History: Family History  Problem Relation Age of Onset  . Colon cancer Neg Hx      Physical Exam:  GEN:  Pleasant Elderly thin 79 year old Caucasian female examined  and in no acute distress; cooperative with exam Filed Vitals:   03/23/12 0130 03/23/12 0145 03/23/12 0200 03/23/12 0203  BP: 145/65 154/66 148/66   Pulse: 88 92 92   Temp:    99.1 F (37.3 C)  TempSrc:    Oral  Resp: 31 30 28    SpO2: 96% 97% 95%    Blood pressure 148/66, pulse 92, temperature 99.1 F (37.3 C), temperature source Oral, resp. rate 28, SpO2 95.00%. PSYCH: She is alert and oriented x4; does not appear anxious does not appear depressed; affect is normal HEENT: Normocephalic and Atraumatic, Mucous membranes pink; PERRLA; EOM intact; Fundi:  Benign;  No scleral icterus, Nares: Patent, Oropharynx: Clear, Poor Fair Dentition, Neck:  FROM, no cervical lymphadenopathy nor thyromegaly or carotid bruit; no  JVD; Breasts:: Not examined CHEST WALL: No tenderness CHEST: Normal respiration, clear to auscultation bilaterally HEART: Regular rate and rhythm; no murmurs rubs or gallops BACK: No kyphosis or scoliosis; no CVA tenderness ABDOMEN: Positive Bowel Sounds, soft non-tender; no masses, no organomegaly, no pannus; no intertriginous candida. Rectal Exam: Not done EXTREMITIES: No bone or joint deformity; age-appropriate arthropathy of the hands and knees; no cyanosis, clubbing or edema; no ulcerations. Genitalia: not examined PULSES: 2+ and symmetric SKIN: Normal hydration no rash or ulceration CNS: Cranial nerves 2-12 grossly intact no focal neurologic deficit    Labs on Admission:  Basic Metabolic Panel:  Lab 03/23/12 4098 03/23/12 0057  NA 144 143  K 4.2 4.3  CL 104 102  CO2 -- 28  GLUCOSE 152* 158*  BUN 54* 53*  CREATININE 2.10* 1.84*  CALCIUM -- 8.8  MG -- --  PHOS -- --   Liver Function Tests: No results found for this basename: AST:5,ALT:5,ALKPHOS:5,BILITOT:5,PROT:5,ALBUMIN:5 in the last 168 hours No results found for this basename: LIPASE:5,AMYLASE:5 in the last 168 hours No results found for this basename: AMMONIA:5 in the last 168 hours CBC:  Lab 03/23/12 0103 03/23/12 0057  WBC -- 11.7*  NEUTROABS -- 9.5*  HGB 11.2* 10.5*  HCT 33.0* 33.7*  MCV -- 97.4  PLT -- 241   Cardiac Enzymes: No results found for this basename: CKTOTAL:5,CKMB:5,CKMBINDEX:5,TROPONINI:5 in the last 168 hours  BNP (last 3 results) No results found for this basename: PROBNP:3 in the last 8760 hours CBG: No results found for this basename: GLUCAP:5 in the last 168 hours  Radiological Exams on Admission: Dg Chest Port 1 View  03/23/2012  *RADIOLOGY REPORT*  Clinical Data: Pain, respiratory distress, fever.  PORTABLE CHEST - 1 VIEW  Comparison: 01/22/2011  Findings: Cardiomegaly.  Mild peribronchial thickening.  Bibasilar atelectasis or infiltrates.  Elevation of the right hemidiaphragm. No  visible effusions or acute bony abnormality.  IMPRESSION: Cardiomegaly, bronchitic changes.  Bibasilar atelectasis or consolidation.   Original Report Authenticated By: Charlett Nose, M.D.     EKG: Independently reviewed.   Assessment: Principal Problem:  *HCAP (healthcare-associated pneumonia) Active Problems:  Hypoxemia  Fever  ARF (acute renal failure)  HTN (hypertension)  CKD (chronic kidney disease), stage IV  Systolic CHF    Plan:       Admit to Telemetry Bed IV Vancomycin and Levaquin NEbs, O2 IVFs Reconcile Home medicatios DVT prophylaxis with Lovenox     Code Status:      DNR/DNI Family Communication:   N/A   Disposition Plan:    Return to Home    Time spent:  37 Minutes  Ron Parker [facility] Pager [phone]  If 7PM-7AM, please contact night-coverage [email or web address] Password [email or web address] 03/23/2012, 2:12 AM

## 2012-03-23 NOTE — Progress Notes (Addendum)
[facility] PROGRESS NOTE  RAMINA HULET ZHY:[id number] DOB: [birth year] DOA: 03/22/2012 PCP: Kimber Relic, MD  Assessment/Plan: Acute hypoxemic respiratory failure/Healthcare associated pneumonia Has not been improving on moxifloxacin and ceftriaxone at [facility].  Chest x-ray shows cardiomegaly and bronchitic changes, bibasilar atelectasis or consolidation.  Start Solu-Medrol as patient likely has reactive area disease.  Influenza panel negative.  Started on vancomycin, levofloxacin and cefepime for age.  Will also request speech therapy consultation to assess patient's swallowing.  Continue neb treatments.  Acute renal failure on chronic kidney disease stage IV Baseline renal function in 2012 was 1.06-1.3.  Renal function on admission was 2.10.  Continue to monitor.  Systolic congestive heart failure? Patient does not appear to be apparent fluid overload to suggest heart failure.  Continue to monitor volume status carefully.  No echocardiogram available for my review.  Will get a 2-D echocardiogram.  Not on ACE/ARB due to CKD.  Restart Lasix tomorrow.  Protein calorie malnutrition Encourage supplemental nutrition.  Generalized weakness Request PT consultation.  Hypothyroidism Continue Synthroid.  Hypertension Stable.  Continue home antihypertensive medications.  Code Status: DO NOT RESUSCITATE/DO NOT INTUBATE.  This was discussed with the patient and with family today, and has GOLD out of facility DO NOT RESUSCITATE form already signed. Family Communication: Family updated at bedside. Disposition Plan: Pending.  Consultants:  None  Procedures:  None  Antibiotics:  Vancomycin 03/23/2012 >>  Levofloxacin 03/23/2012 >>  Cefepime 03/23/2012 >>  HPI/Subjective: Breathing better.  Objective: Filed Vitals:   03/23/12 0200 03/23/12 0203 03/23/12 0300 03/23/12 0500  BP: 148/66  142/67 144/78  Pulse: 92  93 88  Temp:  99.1 F (37.3 C) 98.8 F (37.1 C) 98.5  F (36.9 C)  TempSrc:  Oral Oral Oral  Resp: 28  26 86  Height:   5\' 6"  (1.676 m)   Weight:   63.4 kg (139 lb 12.4 oz) 63.4 kg (139 lb 12.4 oz)  SpO2: 95%  97% 96%   No intake or output data in the 24 hours ending 03/23/12 1016 Filed Weights   03/23/12 0300 03/23/12 0500  Weight: 63.4 kg (139 lb 12.4 oz) 63.4 kg (139 lb 12.4 oz)    Exam: Physical Exam: General: Awake, Oriented, In some distress from shortness of breath. HEENT: EOMI. Neck: Supple CV: S1 and S2 Lungs: Coarse breath sounds bilaterally with scattered wheezing, moderate air movement.  Crackles at bases bilaterally. Abdomen: Soft, Nontender, Nondistended, +bowel sounds. Ext: Good pulses. Trace edema.  Data Reviewed: Basic Metabolic Panel:  Lab 03/23/12 2952 03/23/12 0103 03/23/12 0057  NA 142 144 143  K 4.3 4.2 4.3  CL 102 104 102  CO2 26 -- 28  GLUCOSE 200* 152* 158*  BUN 54* 54* 53*  CREATININE 1.74* 2.10* 1.84*  CALCIUM 9.0 -- 8.8  MG -- -- --  PHOS -- -- --   Liver Function Tests: No results found for this basename: AST:5,ALT:5,ALKPHOS:5,BILITOT:5,PROT:5,ALBUMIN:5 in the last 168 hours No results found for this basename: LIPASE:5,AMYLASE:5 in the last 168 hours No results found for this basename: AMMONIA:5 in the last 168 hours CBC:  Lab 03/23/12 0558 03/23/12 0103 03/23/12 0057  WBC 11.1* -- 11.7*  NEUTROABS -- -- 9.5*  HGB 10.7* 11.2* 10.5*  HCT 34.0* 33.0* 33.7*  MCV 98.0 -- 97.4  PLT 238 -- 241   Cardiac Enzymes: No results found for this basename: CKTOTAL:5,CKMB:5,CKMBINDEX:5,TROPONINI:5 in the last 168 hours BNP (last 3 results) No results found for this basename: PROBNP:3 in the  last 8760 hours CBG: No results found for this basename: GLUCAP:5 in the last 168 hours  Recent Results (from the past 240 hour(s))  MRSA PCR SCREENING     Status: Abnormal   Collection Time   03/23/12  3:44 AM      Component Value Range Status Comment   MRSA by PCR POSITIVE (*) NEGATIVE Final       Studies: Dg Chest Port 1 View  03/23/2012  *RADIOLOGY REPORT*  Clinical Data: Pain, respiratory distress, fever.  PORTABLE CHEST - 1 VIEW  Comparison: 01/22/2011  Findings: Cardiomegaly.  Mild peribronchial thickening.  Bibasilar atelectasis or infiltrates.  Elevation of the right hemidiaphragm. No visible effusions or acute bony abnormality.  IMPRESSION: Cardiomegaly, bronchitic changes.  Bibasilar atelectasis or consolidation.   Original Report Authenticated By: Charlett Nose, M.D.     Scheduled Meds:   . albuterol  2.5 mg Nebulization Q6H  . ceFEPime (MAXIPIME) IV  1 g Intravenous Q24H  . Chlorhexidine Gluconate Cloth  6 each Topical Q0600  . enoxaparin (LOVENOX) injection  30 mg Subcutaneous Daily  . levofloxacin (LEVAQUIN) IV  250 mg Intravenous Q48H  . mupirocin ointment  1 application Nasal BID  . sodium chloride  3 mL Intravenous Q12H  . vancomycin  500 mg Intravenous Q48H   Continuous Infusions:   . sodium chloride 75 mL/hr at 03/23/12 0348    Principal Problem:  *HCAP (healthcare-associated pneumonia) Active Problems:  HTN (hypertension)  CKD (chronic kidney disease), stage IV  Hypoxemia  Fever  ARF (acute renal failure)  Systolic CHF   Anchor Dwan A  [facility] Pager [phone]. If 8PM-8AM, please contact night-coverage at [email or web address], password [email or web address] 03/23/2012, 10:16 AM  LOS: 1 day

## 2012-03-23 NOTE — Progress Notes (Signed)
ANTIBIOTIC CONSULT NOTE - INITIAL  Pharmacy Consult for cefepime Indication: pneumonia  Allergies  Allergen Reactions  . Anaprox (Naproxen Sodium)     Unknown reaction    Patient Measurements: Height: 5\' 6"  (167.6 cm) Weight: 139 lb 12.4 oz (63.4 kg) IBW/kg (Calculated) : 59.3   Vital Signs: Temp: 98.5 F (36.9 C) (01/12 0500) Temp src: Oral (01/12 0500) BP: 144/78 mmHg (01/12 0500) Pulse Rate: 88  (01/12 0500) Intake/Output from previous day:   Intake/Output from this shift:    Labs:  Basename 03/23/12 0558 03/23/12 0103 03/23/12 0057  WBC 11.1* -- 11.7*  HGB 10.7* 11.2* 10.5*  PLT 238 -- 241  LABCREA -- -- --  CREATININE 1.74* 2.10* 1.84*   Estimated Creatinine Clearance: 18.9 ml/min (by C-G formula based on Cr of 1.74). No results found for this basename: VANCOTROUGH:2,VANCOPEAK:2,VANCORANDOM:2,GENTTROUGH:2,GENTPEAK:2,GENTRANDOM:2,TOBRATROUGH:2,TOBRAPEAK:2,TOBRARND:2,AMIKACINPEAK:2,AMIKACINTROU:2,AMIKACIN:2, in the last 72 hours   Microbiology: Recent Results (from the past 720 hour(s))  MRSA PCR SCREENING     Status: Abnormal   Collection Time   03/23/12  3:44 AM      Component Value Range Status Comment   MRSA by PCR POSITIVE (*) NEGATIVE Final     Medical History: Past Medical History  Diagnosis Date  . Hypothyroidism   . Depression   . Migraines   . Dental pulp degeneration   . Corns and callus   . Gait disorder   . Urinary incontinence   . Skin cancer   . Cataract   . Skin cancer   . Insomnia     Medications:  Prescriptions prior to admission  Medication Sig Dispense Refill  . acetaminophen (TYLENOL) 325 MG tablet Take 650 mg by mouth every 6 (six) hours as needed. pain      . albuterol (PROVENTIL) (5 MG/ML) 0.5% nebulizer solution Take 2.5 mg by nebulization every 4 (four) hours as needed. wheezing      . aspirin EC 81 MG tablet Take 81 mg by mouth daily.      . brimonidine-timolol (COMBIGAN) 0.2-0.5 % ophthalmic solution Place 1 drop into  both eyes every 12 (twelve) hours.      . Cholecalciferol 1000 UNITS tablet Take 1,000 Units by mouth 2 (two) times daily.      . clopidogrel (PLAVIX) 75 MG tablet Take 75 mg by mouth daily.        Marland Kitchen dextrose 5 % SOLN 50 mL with cefTRIAXone 1 G SOLR 1 g Inject 1 g into the vein daily. For 5 days beginning 03/22/12 Upper respiratory infection      . furosemide (LASIX) 20 MG tablet Take 2 tablets (40 mg total) by mouth 2 (two) times daily.  60 tablet  0  . guaiFENesin (MUCINEX) 600 MG 12 hr tablet Take 1,200 mg by mouth 2 (two) times daily as needed. congestion      . Ipratropium-Albuterol (DUONEB IN) Inhale 1 ampule into the lungs every 6 (six) hours as needed. wheezing      . latanoprost (XALATAN) 0.005 % ophthalmic solution Place 1 drop into both eyes at bedtime.      Marland Kitchen levothyroxine (SYNTHROID, LEVOTHROID) 100 MCG tablet Take 100 mcg by mouth daily.      . metoprolol (LOPRESSOR) 50 MG tablet Take 50 mg by mouth 2 (two) times daily.      Marland Kitchen moxifloxacin (AVELOX) 400 MG tablet Take 400 mg by mouth daily.      . Multiple Vitamins-Minerals (PRESERVISION AREDS 2 PO) Take 1 capsule by mouth 2 (two) times daily.      Marland Kitchen  pantoprazole (PROTONIX) 40 MG tablet Take 40 mg by mouth daily.      Marland Kitchen saccharomyces boulardii (FLORASTOR) 250 MG capsule Take 250 mg by mouth 2 (two) times daily.      Marland Kitchen senna-docusate (SENOKOT-S) 8.6-50 MG per tablet Take 1 tablet by mouth 2 (two) times daily.      Marland Kitchen acetaminophen (TYLENOL) 500 MG tablet Take 1,000 mg by mouth every 6 (six) hours as needed. For pain       . bisacodyl (DULCOLAX) 5 MG EC tablet Take 5-10 mg by mouth daily as needed. For constipation      . metoprolol (LOPRESSOR) 50 MG tablet Take 1 tablet (50 mg total) by mouth 2 (two) times daily.  60 tablet  0  . nitroGLYCERIN (NITROSTAT) 0.4 MG SL tablet Place 0.4 mg under the tongue once.         Assessment: [name] admitted from NH for pneumonia. Pt was treated with Avelox and Rocephin at the facility with no  improvement. Pt currently on vancomycin and Levaquin currently. Starting cefepime.    Goal of Therapy:  eradication of infection  Plan:  Start cefepime 1g q24h F/u cultures, renal function, and clinical course    Doris Cheadle, PharmD Clinical Pharmacist Pager: [phone] Phone: [phone] 03/23/2012 10:18 AM

## 2012-03-23 NOTE — ED Provider Notes (Signed)
History     CSN: [id number]  Arrival date & time 03/22/12  2347   First MD Initiated Contact with Patient 03/22/12 2354      Chief Complaint  Patient presents with  . Respiratory Distress    (Consider location/radiation/quality/duration/timing/severity/associated sxs/prior treatment) HPI Comments: 79 year old female with a history of hypertension, anemia, myocardial infarction with cardiomyopathy and congestive heart failure who presents with a complaint of shortness of breath. She states this has been going on just over 7 days, gradually worsening, nothing seems to make it better or worse and it does not seem to be responsive to albuterol and 2 days of Rocephin. Paramedics found the patient hypoxic with oxygen saturations of 87% which improved to 97% on supplemental oxygen at 4 L by nasal cannula. The patient denies having a history of oxygen requirement in the past. Family members who present with the patient state that they do agree that she is significantly labored in her breathing this evening.  The history is provided by the patient, the EMS personnel and a relative.    Past Medical History  Diagnosis Date  . Hypothyroidism   . Depression   . Migraines   . Dental pulp degeneration   . Corns and callus   . Gait disorder   . Urinary incontinence   . Skin cancer   . Cataract   . Skin cancer   . Insomnia     Past Surgical History  Procedure Date  . Total hip arthroplasty   . Cataract extraction   . Abdominal hysterectomy     Family History  Problem Relation Age of Onset  . Colon cancer Neg Hx     History  Substance Use Topics  . Smoking status: Never Smoker   . Smokeless tobacco: Never Used  . Alcohol Use: No    OB History    Grav Para Term Preterm Abortions TAB SAB Ect Mult Living                  Review of Systems  All other systems reviewed and are negative.    Allergies  Anaprox  Home Medications   Current Outpatient Rx  Name  Route  Sig   Dispense  Refill  . ACETAMINOPHEN 325 MG PO TABS   Oral   Take 650 mg by mouth every 6 (six) hours as needed. pain         . ALBUTEROL SULFATE (5 MG/ML) 0.5% IN NEBU   Nebulization   Take 2.5 mg by nebulization every 4 (four) hours as needed. wheezing         . ASPIRIN EC 81 MG PO TBEC   Oral   Take 81 mg by mouth daily.         Marland Kitchen BRIMONIDINE TARTRATE-TIMOLOL 0.2-0.5 % OP SOLN   Both Eyes   Place 1 drop into both eyes every 12 (twelve) hours.         . CHOLECALCIFEROL 1000 UNITS PO TABS   Oral   Take 1,000 Units by mouth 2 (two) times daily.         Marland Kitchen CLOPIDOGREL BISULFATE 75 MG PO TABS   Oral   Take 75 mg by mouth daily.           Marland Kitchen CEFTRIAXONE 1 GM IVPB   Intravenous   Inject 1 g into the vein daily. For 5 days beginning 03/22/12 Upper respiratory infection         . FUROSEMIDE 20 MG PO TABS  Oral   Take 2 tablets (40 mg total) by mouth 2 (two) times daily.   60 tablet   0   . GUAIFENESIN ER 600 MG PO TB12   Oral   Take 1,200 mg by mouth 2 (two) times daily as needed. congestion         . DUONEB IN   Inhalation   Inhale 1 ampule into the lungs every 6 (six) hours as needed. wheezing         . LATANOPROST 0.005 % OP SOLN   Both Eyes   Place 1 drop into both eyes at bedtime.         Marland Kitchen LEVOTHYROXINE SODIUM 100 MCG PO TABS   Oral   Take 100 mcg by mouth daily.         Marland Kitchen METOPROLOL TARTRATE 50 MG PO TABS   Oral   Take 50 mg by mouth 2 (two) times daily.         Marland Kitchen MOXIFLOXACIN HCL 400 MG PO TABS   Oral   Take 400 mg by mouth daily.         Marland Kitchen PRESERVISION AREDS 2 PO   Oral   Take 1 capsule by mouth 2 (two) times daily.         Marland Kitchen PANTOPRAZOLE SODIUM 40 MG PO TBEC   Oral   Take 40 mg by mouth daily.         Marland Kitchen SACCHAROMYCES BOULARDII 250 MG PO CAPS   Oral   Take 250 mg by mouth 2 (two) times daily.         Bernadette Hoit SODIUM 8.6-50 MG PO TABS   Oral   Take 1 tablet by mouth 2 (two) times daily.         .  ACETAMINOPHEN 500 MG PO TABS   Oral   Take 1,000 mg by mouth every 6 (six) hours as needed. For pain          . BISACODYL 5 MG PO TBEC   Oral   Take 5-10 mg by mouth daily as needed. For constipation         . METOPROLOL TARTRATE 50 MG PO TABS   Oral   Take 1 tablet (50 mg total) by mouth 2 (two) times daily.   60 tablet   0   . NITROGLYCERIN 0.4 MG SL SUBL   Sublingual   Place 0.4 mg under the tongue once.             BP 110/54  Pulse 87  Temp 101 F (38.3 C) (Rectal)  Resp 27  SpO2 93%  Physical Exam  Nursing note and vitals reviewed. Constitutional: She appears well-developed and well-nourished. She appears distressed.  HENT:  Head: Normocephalic and atraumatic.  Mouth/Throat: Oropharynx is clear and moist. No oropharyngeal exudate.  Eyes: Conjunctivae normal and EOM are normal. Pupils are equal, round, and reactive to light. Right eye exhibits no discharge. Left eye exhibits no discharge. No scleral icterus.  Neck: Normal range of motion. Neck supple. No JVD present. No thyromegaly present.  Cardiovascular: Normal rate, regular rhythm, normal heart sounds and intact distal pulses.  Exam reveals no gallop and no friction rub.   No murmur heard. Pulmonary/Chest: She is in respiratory distress. She has wheezes. She has rales.       Increased work of breathing, mild accessory muscle use, mild tachypnea, hypoxia  Abdominal: Soft. Bowel sounds are normal. She exhibits no distension and no mass. There is no  tenderness.  Musculoskeletal: Normal range of motion. She exhibits no edema and no tenderness.       No significant peripheral edema or asymmetry of the lower extremities  Lymphadenopathy:    She has no cervical adenopathy.  Neurological: She is alert. Coordination normal.  Skin: Skin is warm and dry. No rash noted. No erythema.  Psychiatric: She has a normal mood and affect. Her behavior is normal.    ED Course  Procedures (including critical care  time)  Labs Reviewed  CBC WITH DIFFERENTIAL - Abnormal; Notable for the following:    WBC 11.7 (*)     RBC 3.46 (*)     Hemoglobin 10.5 (*)     HCT 33.7 (*)     RDW 15.9 (*)     Neutrophils Relative 81 (*)     Neutro Abs 9.5 (*)     Lymphocytes Relative 8 (*)     Monocytes Absolute 1.2 (*)     All other components within normal limits  BASIC METABOLIC PANEL - Abnormal; Notable for the following:    Glucose, Bld 158 (*)     BUN 53 (*)     Creatinine, Ser 1.84 (*)     GFR calc non Af Amer 23 (*)     GFR calc Af Amer 26 (*)     All other components within normal limits  POCT I-STAT, CHEM 8 - Abnormal; Notable for the following:    BUN 54 (*)     Creatinine, Ser 2.10 (*)     Glucose, Bld 152 (*)     Calcium, Ion 1.11 (*)     Hemoglobin 11.2 (*)     HCT 33.0 (*)     All other components within normal limits  CG4 I-STAT (LACTIC ACID)   Dg Chest Port 1 View  03/23/2012  *RADIOLOGY REPORT*  Clinical Data: Pain, respiratory distress, fever.  PORTABLE CHEST - 1 VIEW  Comparison: 01/22/2011  Findings: Cardiomegaly.  Mild peribronchial thickening.  Bibasilar atelectasis or infiltrates.  Elevation of the right hemidiaphragm. No visible effusions or acute bony abnormality.  IMPRESSION: Cardiomegaly, bronchitic changes.  Bibasilar atelectasis or consolidation.   Original Report Authenticated By: Charlett Nose, M.D.      1. Sepsis   2. HCAP (healthcare-associated pneumonia)   3. ARF (acute renal failure)       MDM  The patient does have respiratory distress, is likely from a pulmonary source, she does have abnormal lung sounds in all fields. Chest x-ray pending, labs, EKG, supplemental oxygenation.  ED ECG REPORT  I personally interpreted this EKG   Date: 03/23/2012   Rate: 83  Rhythm: normal sinus rhythm  QRS Axis: normal  Intervals: QRS prolonged  ST/T Wave abnormalities: nonspecific T wave changes  Conduction Disutrbances:nonspecific intraventricular conduction delay  Narrative  Interpretation: Anterior MI, age undetermined  Old EKG Reviewed: Compared with 01/20/2011, no significant changes.  Patient reevaluated, more comfortable on supplemental oxygen.  2 view of the chest including PA and lateral views shows that the patient has a left lower lobe infiltrate, cardiomegaly and bibasilar atelectasis or infiltrates. I personally interpreted this x-ray.  Antibiotics have been ordered, laboratory data shows that the patient is in acute renal failure, IV fluids have been ordered as well. I discussed the patient's care with the hospitalist who will admit to telemetry bed.  The patient has a fever, hypoxia with respiratory distress and a focal infiltrate on the x-ray which is consistent with systemic inflammatory response syndrome and  early sepsis. Critical care provided with IV antibiotics, frequent re\re evaluations, cardiac monitoring, IV antibiotics.  CRITICAL CARE Performed by: Vida Roller   Total critical care time: 35  Critical care time was exclusive of separately billable procedures and treating other patients.  Critical care was necessary to treat or prevent imminent or life-threatening deterioration.  Critical care was time spent personally by me on the following activities: development of treatment plan with patient and/or surrogate as well as nursing, discussions with consultants, evaluation of patient's response to treatment, examination of patient, obtaining history from patient or surrogate, ordering and performing treatments and interventions, ordering and review of laboratory studies, ordering and review of radiographic studies, pulse oximetry and re-evaluation of patient's condition.    Vida Roller, MD 03/23/12 [phone]

## 2012-03-23 NOTE — Progress Notes (Signed)
ANTIBIOTIC CONSULT NOTE - INITIAL  Pharmacy Consult for vancomycin Indication: rule out pneumonia  Allergies  Allergen Reactions  . Anaprox (Naproxen Sodium)     Unknown reaction    Patient Measurements:    Vital Signs: Temp: 99.1 F (37.3 C) (01/12 0203) Temp src: Oral (01/12 0203) BP: 148/66 mmHg (01/12 0200) Pulse Rate: 92  (01/12 0200) Intake/Output from previous day:   Intake/Output from this shift:    Labs:  Basename 03/23/12 0103 03/23/12 0057  WBC -- 11.7*  HGB 11.2* 10.5*  PLT -- 241  LABCREA -- --  CREATININE 2.10* 1.84*   The CrCl is unknown because both a height and weight (above a minimum accepted value) are required for this calculation. No results found for this basename: VANCOTROUGH:2,VANCOPEAK:2,VANCORANDOM:2,GENTTROUGH:2,GENTPEAK:2,GENTRANDOM:2,TOBRATROUGH:2,TOBRAPEAK:2,TOBRARND:2,AMIKACINPEAK:2,AMIKACINTROU:2,AMIKACIN:2, in the last 72 hours   Microbiology: No results found for this or any previous visit (from the past 720 hour(s)).  Medical History: Past Medical History  Diagnosis Date  . Hypothyroidism   . Depression   . Migraines   . Dental pulp degeneration   . Corns and callus   . Gait disorder   . Urinary incontinence   . Skin cancer   . Cataract   . Skin cancer   . Insomnia     Medications:  Scheduled:    . albuterol  2.5 mg Nebulization Q6H  . enoxaparin (LOVENOX) injection  30 mg Subcutaneous Q24H  . sodium chloride  3 mL Intravenous Q12H  . vancomycin  1,000 mg Intravenous Once   Assessment: 79 yo female presented in respiratory distress. Pharmacy to manage vancomycin. Patient also to receive levofloxacin. Patient with order for vancomycin 1gm x 1 and levofloxacin 500mg  IV x1 in ED. Patient with order for levofloxacin 500mg  IV daily.   Goal of Therapy:  Vancomycin trough level 15-20 mcg/ml  Plan:  1. Vancomycin 500mg  IV Q48H (after vancomycin 1gm x 1 in ED).  2. Adjust levofloxacin to 250mg  IV Q48H (after receiving  500mg  levofloxacin x 1 in ED).  Emeline Gins 03/23/2012,2:19 AM

## 2012-03-24 DIAGNOSIS — R739 Hyperglycemia, unspecified: Secondary | ICD-10-CM | POA: Diagnosis present

## 2012-03-24 LAB — BASIC METABOLIC PANEL
BUN: 60 mg/dL — ABNORMAL HIGH (ref 6–23)
Creatinine, Ser: 1.75 mg/dL — ABNORMAL HIGH (ref 0.50–1.10)
GFR calc Af Amer: 28 mL/min — ABNORMAL LOW (ref >90)
GFR calc non Af Amer: 24 mL/min — ABNORMAL LOW (ref >90)
Glucose, Bld: 191 mg/dL — ABNORMAL HIGH (ref 70–99)

## 2012-03-24 LAB — CBC
HCT: 33.1 % — ABNORMAL LOW (ref 36.0–46.0)
Hemoglobin: 10 g/dL — ABNORMAL LOW (ref 12.0–15.0)
MCHC: 30.2 g/dL (ref 30.0–36.0)
MCV: 98.5 fL (ref 78.0–100.0)
RDW: 16.1 % — ABNORMAL HIGH (ref 11.5–15.5)

## 2012-03-24 MED ORDER — FUROSEMIDE 10 MG/ML IJ SOLN
40.0000 mg | Freq: Two times a day (BID) | INTRAMUSCULAR | Status: DC
Start: 2012-03-24 — End: 2012-03-26
  Administered 2012-03-24 – 2012-03-25 (×3): 40 mg via INTRAVENOUS
  Filled 2012-03-24 (×6): qty 4

## 2012-03-24 NOTE — Progress Notes (Signed)
Inpatient Diabetes Program Recommendations  AACE/ADA: New Consensus Statement on Inpatient Glycemic Control (2013)  Target Ranges:  Prepandial:   less than 140 mg/dL      Peak postprandial:   less than 180 mg/dL (1-2 hours)      Critically ill patients:  140 - 180 mg/dL   Reason for Visit: Hyperglycemia  Results for [name], [name] (MRN [id number]) as of 03/24/2012 13:18  Ref. Range 01/21/2011 06:25 01/22/2011 06:42 03/23/2012 00:57 03/23/2012 01:03 03/23/2012 05:58 03/24/2012 09:44  Glucose Latest Range: 70-99 mg/dL 86 85 409 (H) 811 (H) 914 (H) 191 (H)   Note: Glucose elevating presumably secondary to use of steroids.  If compatible with patient goals of care, please consider checking CBG's ac and hs, ordering correction Novolog, and ordering a Hgb A1C.    Request MD add an appropriate diagnosis related to hyperglycemia to Hospital Problem List.  Thank you. Dawn Bitton S. Elsie Lincoln, RN, CNS, CDE Inpatient Diabetes Program, team pager [phone]

## 2012-03-24 NOTE — Progress Notes (Signed)
*  PRELIMINARY RESULTS* Echocardiogram 2D Echocardiogram has been performed.  [name] 03/24/2012, 12:24 PM

## 2012-03-24 NOTE — Clinical Documentation Improvement (Signed)
SEPSIS DOCUMENTATION QUERY  THIS DOCUMENT IS NOT A PERMANENT PART OF THE MEDICAL RECORD   Please update your documentation within the medical record to reflect your response to this query.                                                                                     03/24/12  Dr. Betti Cruz and/or Associates,  In a better effort to capture your patient's severity of illness, reflect appropriate length of stay and utilization of resources, a review of the patient medical record has revealed the following information:  "laboratory data shows that the patient is in acute renal failure, IV fluids have been ordered as well. I discussed the patient's care with the hospitalist who will admit to telemetry bed."  "The patient has a fever, hypoxia with respiratory distress and a focal infiltrate on the x-ray which is consistent with systemic inflammatory response syndrome and early sepsis. Critical care provided with IV antibiotics, frequent re\re evaluations, cardiac monitoring, IV antibiotics" Vida Roller, MD  03/23/12 [phone] ED Note  BP 110/54  Pulse 87  Temp 101 F (38.3 C) (Rectal)  Resp 27  SpO2 93% Lactic Acid - 1.17   Based on your clinical judgment, please document in the progress notes and discharge summary, if you Agree or Disagree with the diagnosis of "systemic inflammatory response syndrome and early sepsis"  If Agreed, please document if "Present on Admission".    In responding to this query please exercise your independent judgment.    The fact that a query is asked, does not imply that any particular answer is desired or expected.   Reviewed:  no additional documentation provided  Thank You,  Jerral Ralph  RN BSN CCDS Certified Clinical Documentation Specialist: Cell   [phone]  Health Information Management [place]   TO RESPOND TO THE THIS QUERY, FOLLOW THE INSTRUCTIONS BELOW:  1. If needed, update documentation for the patient's encounter via  the notes activity.  2. Access this query again and click edit on the In [employer].  3. After updating, or not, click F2 to complete all highlighted (required) fields concerning your review. Select "additional documentation in the medical record" OR "no additional documentation provided".  4. Click Sign note button.  5. The deficiency will fall out of your In Basket *Please let us know if you are not able to complete this workflow by phone or e-mail (listed below).

## 2012-03-24 NOTE — Evaluation (Signed)
Physical Therapy Evaluation Patient Details Name: [name] MRN: [id number] DOB: 27-Jun-1916 Today's Date: 03/24/2012 Time: 7829-5621 PT Time Calculation (min): 16 min  PT Assessment / Plan / Recommendation Clinical Impression  Pt admitted with HCAP and will benefit from acute therapy to maximize mobility to return pt to PLOF prior to return to SNF. Pt encouraged to be OOB daily with nursing and continue ambulation attempts. Pt states she generally walks 2-3x/day grossly 30' with RW and assist. Pt maintained 95% on 4L throughout with HR 85.     PT Assessment  Patient needs continued PT services    Follow Up Recommendations  SNF    Does the patient have the potential to tolerate intense rehabilitation      Barriers to Discharge None      Equipment Recommendations  None recommended by PT    Recommendations for Other Services     Frequency Min 2X/week    Precautions / Restrictions Precautions Precautions: Fall   Pertinent Vitals/Pain No pain      Mobility  Bed Mobility Bed Mobility: Rolling Left;Left Sidelying to Sit;Sitting - Scoot to Edge of Bed Rolling Left: 4: Min guard Left Sidelying to Sit: 3: Mod assist;HOB elevated;With rails (HOB 15 degrees) Sitting - Scoot to Edge of Bed: 3: Mod assist Details for Bed Mobility Assistance: assist to flex hips and bring legs to EOB as well as to elevate trunk from surface. use of pad to scoot to EOB Transfers Transfers: Sit to Stand;Stand to Sit Sit to Stand: 3: Mod assist;From bed Stand to Sit: 4: Min assist;With armrests;To chair/3-in-1 Details for Transfer Assistance: cueing for hand placement and sequence as pt initially trying to hold bil PT hands to stand from bed but unable and required bil UE assist to push off surface with assist for anterior translation Ambulation/Gait Ambulation/Gait Assistance: 4: Min assist Ambulation Distance (Feet): 9 Feet Assistive device: Rolling walker Ambulation/Gait Assistance Details:  max cueing for safety and sequence to step into rW and extend trunk as pt maintains fully flexed position Gait Pattern: Shuffle;Trunk flexed;Narrow base of support Gait velocity: decreased Stairs: No    Shoulder Instructions     Exercises     PT Diagnosis: Difficulty walking  PT Problem List: Decreased activity tolerance;Decreased mobility;Cardiopulmonary status limiting activity;Decreased knowledge of use of DME PT Treatment Interventions: Gait training;DME instruction;Functional mobility training;Therapeutic activities;Therapeutic exercise;Patient/family education   PT Goals Acute Rehab PT Goals PT Goal Formulation: With patient Time For Goal Achievement: 04/07/12 Potential to Achieve Goals: Fair Pt will go Supine/Side to Sit: with min assist;with rail PT Goal: Supine/Side to Sit - Progress: Goal set today Pt will go Sit to Supine/Side: with min assist;with rail PT Goal: Sit to Supine/Side - Progress: Goal set today Pt will go Sit to Stand: with min assist PT Goal: Sit to Stand - Progress: Goal set today Pt will go Stand to Sit: with supervision PT Goal: Stand to Sit - Progress: Goal set today Pt will Ambulate: 16 - 50 feet;with min assist;with rolling walker PT Goal: Ambulate - Progress: Goal set today  Visit Information  Last PT Received On: 03/24/12 Assistance Needed: +1    Subjective Data  Subjective: I have this terrible congestion Patient Stated Goal: return to friends home   Prior Functioning  Home Living Lives With: Other (Comment) Available Help at Discharge: Skilled Nursing Facility Type of Home: Skilled Nursing Facility Home Layout: One level Home Adaptive Equipment: Walker - rolling;Hospital bed Prior Function Level of Independence: Needs assistance Needs  Assistance: Bathing;Dressing;Meal Prep;Light Housekeeping;Transfers;Gait Bath: Moderate Dressing: Maximal Meal Prep: Total Light Housekeeping: Total Gait Assistance: min assist with RW max 30' at  baseline Transfer Assistance: min-mod assist Able to Take Stairs?: No Driving: No Vocation: Retired Musician: Clinical cytogeneticist  Overall Cognitive Status: Impaired Area of Impairment: Safety/judgement Arousal/Alertness: Awake/alert Orientation Level: Appears intact for tasks assessed Behavior During Session: [facility] for tasks performed Safety/Judgement: Decreased safety judgement for tasks assessed    Extremity/Trunk Assessment Right Lower Extremity Assessment RLE ROM/Strength/Tone: [facility] for tasks assessed (4/5 hip flexion and knee extension bilaterally) Left Lower Extremity Assessment LLE ROM/Strength/Tone: [facility] for tasks assessed (4/5 hip flexion and knee extension bilaterally) Trunk Assessment Trunk Assessment: Kyphotic   Balance Static Sitting Balance Static Sitting - Balance Support: Feet supported;No upper extremity supported Static Sitting - Level of Assistance: 5: Stand by assistance Static Sitting - Comment/# of Minutes: 2  End of Session PT - End of Session Equipment Utilized During Treatment: Gait belt;Oxygen Activity Tolerance: Patient tolerated treatment well Patient left: in chair;with call bell/phone within reach  GP     Delorse Lek 03/24/2012, 2:48 PM  Delaney Meigs, PT [phone]

## 2012-03-24 NOTE — Progress Notes (Signed)
[facility] PROGRESS NOTE  [name] WUJ:[id number] DOB: Sep 05, 1916 DOA: 03/22/2012 PCP: Kimber Relic, MD  Assessment/Plan: Acute hypoxemic respiratory failure/Healthcare associated pneumonia Has not been improving on moxifloxacin and ceftriaxone at [facility].  Chest x-ray shows cardiomegaly and bronchitic changes, bibasilar atelectasis or consolidation.  Continue Solu-Medrol as patient likely has reactive area disease.  Influenza panel negative.  Continue on vancomycin, levofloxacin and cefepime for antibiotic coverage.  Speech therapy consultation to assess patient's swallowing pending.  Continue neb treatments.  Acute renal failure on chronic kidney disease stage IV Baseline renal function in 2012 was 1.06-1.3.  Renal function on admission was 2.10.  Improved, continue to monitor.  Systolic congestive heart failure Patient has elevated JVD on exam with 1+ lower extremity peripherally.  2-D echocardiogram reviewed with results as indicated below.  Not on ACE/ARB due to CKD.  Discontinue oral furosemide, start furosemide 40 mg IV twice daily.  Protein calorie malnutrition Encourage supplemental nutrition.  Generalized weakness Continue PT consultation.  Hypothyroidism Continue Synthroid.  Hypertension Stable.  Continue home antihypertensive medications.  Code Status: DO NOT RESUSCITATE/DO NOT INTUBATE. Family Communication: Family updated at bedside. Disposition Plan: Pending.  Consultants:  None  Procedures: 2-D echocardiogram on 03/24/2012 Study Conclusions - Left ventricle: The cavity size was normal. There was mild concentric hypertrophy. Systolic function was moderately to severely reduced. The estimated ejection fraction was in the range of 30% to 35%. There is akinesis of the mid-distalanteroseptal and inferoseptal myocardium. No evidence of thrombus. - Aortic valve: Trivial regurgitation. - Mitral valve: Calcified annulus. Mildly thickened leaflets.  Mild regurgitation. Impressions: - Compared to the prior study, there has been no significant interval change.  Antibiotics:  Vancomycin 03/23/2012 >>  Levofloxacin 03/23/2012 >>  Cefepime 03/23/2012 >>  HPI/Subjective: Still short of breath, but reports breathing improved from yesterday.  Objective: Filed Vitals:   03/23/12 2059 03/24/12 0220 03/24/12 0557 03/24/12 1013  BP: 168/99  166/94   Pulse: 106  85   Temp: 97.3 F (36.3 C)  97.8 F (36.6 C)   TempSrc: Oral  Oral   Resp: 28  18   Height:      Weight:   62.5 kg (137 lb 12.6 oz)   SpO2: 98% 99% 96% 96%    Intake/Output Summary (Last 24 hours) at 03/24/12 1104 Last data filed at 03/24/12 0852  Gross per 24 hour  Intake   1715 ml  Output      0 ml  Net   1715 ml   Filed Weights   03/23/12 0300 03/23/12 0500 03/24/12 0557  Weight: 63.4 kg (139 lb 12.4 oz) 63.4 kg (139 lb 12.4 oz) 62.5 kg (137 lb 12.6 oz)    Exam: Physical Exam: General: Awake, Oriented, In some distress from shortness of breath. HEENT: EOMI. Neck: Supple CV: S1 and S2 Lungs: Coarse breath sounds bilaterally with scattered wheezing, moderate air movement.  Crackles at bases bilaterally. Abdomen: Soft, Nontender, Nondistended, +bowel sounds. Ext: Good pulses. Trace edema.  Data Reviewed: Basic Metabolic Panel:  Lab 03/23/12 9562 03/23/12 0103 03/23/12 0057  NA 142 144 143  K 4.3 4.2 4.3  CL 102 104 102  CO2 26 -- 28  GLUCOSE 200* 152* 158*  BUN 54* 54* 53*  CREATININE 1.74* 2.10* 1.84*  CALCIUM 9.0 -- 8.8  MG -- -- --  PHOS -- -- --   Liver Function Tests: No results found for this basename: AST:5,ALT:5,ALKPHOS:5,BILITOT:5,PROT:5,ALBUMIN:5 in the last 168 hours No results found for this basename: LIPASE:5,AMYLASE:5  in the last 168 hours No results found for this basename: AMMONIA:5 in the last 168 hours CBC:  Lab 03/24/12 0944 03/23/12 0558 03/23/12 0103 03/23/12 0057  WBC 8.2 11.1* -- 11.7*  NEUTROABS -- -- -- 9.5*  HGB  10.0* 10.7* 11.2* 10.5*  HCT 33.1* 34.0* 33.0* 33.7*  MCV 98.5 98.0 -- 97.4  PLT 239 238 -- 241   Cardiac Enzymes: No results found for this basename: CKTOTAL:5,CKMB:5,CKMBINDEX:5,TROPONINI:5 in the last 168 hours BNP (last 3 results) No results found for this basename: PROBNP:3 in the last 8760 hours CBG: No results found for this basename: GLUCAP:5 in the last 168 hours  Recent Results (from the past 240 hour(s))  MRSA PCR SCREENING     Status: Abnormal   Collection Time   03/23/12  3:44 AM      Component Value Range Status Comment   MRSA by PCR POSITIVE (*) NEGATIVE Final      Studies: Dg Chest Port 1 View  03/23/2012  *RADIOLOGY REPORT*  Clinical Data: Pain, respiratory distress, fever.  PORTABLE CHEST - 1 VIEW  Comparison: 01/22/2011  Findings: Cardiomegaly.  Mild peribronchial thickening.  Bibasilar atelectasis or infiltrates.  Elevation of the right hemidiaphragm. No visible effusions or acute bony abnormality.  IMPRESSION: Cardiomegaly, bronchitic changes.  Bibasilar atelectasis or consolidation.   Original Report Authenticated By: Charlett Nose, M.D.     Scheduled Meds:    . albuterol  2.5 mg Nebulization Q6H  . aspirin EC  81 mg Oral Daily  . brimonidine  1 drop Both Eyes BID  . ceFEPime (MAXIPIME) IV  1 g Intravenous Q24H  . Chlorhexidine Gluconate Cloth  6 each Topical Q0600  . Cholecalciferol  1,000 Units Oral BID  . clopidogrel  75 mg Oral Q breakfast  . enoxaparin (LOVENOX) injection  30 mg Subcutaneous Daily  . furosemide  40 mg Oral BID  . ipratropium  0.5 mg Nebulization Q6H  . latanoprost  1 drop Both Eyes QHS  . levofloxacin (LEVAQUIN) IV  250 mg Intravenous Q48H  . levothyroxine  100 mcg Oral QAC breakfast  . methylPREDNISolone (SOLU-MEDROL) injection  40 mg Intravenous Q8H  . metoprolol  37.5 mg Oral BID  . mupirocin ointment  1 application Nasal BID  . pantoprazole  40 mg Oral Q1200  . saccharomyces boulardii  250 mg Oral BID  . senna-docusate  1  tablet Oral BID  . sodium chloride  3 mL Intravenous Q12H  . timolol  1 drop Both Eyes BID  . vancomycin  500 mg Intravenous Q48H   Continuous Infusions:   Principal Problem:  *HCAP (healthcare-associated pneumonia) Active Problems:  HTN (hypertension)  CKD (chronic kidney disease), stage IV  Hypoxemia  Fever  ARF (acute renal failure)  Systolic CHF   Domique Clapper A  [facility] Pager [phone]. If 8PM-8AM, please contact night-coverage at [email or web address], password [facility] 03/24/2012, 11:04 AM  LOS: 2 days

## 2012-03-25 LAB — HEMOGLOBIN A1C: Mean Plasma Glucose: 128 mg/dL — ABNORMAL HIGH (ref <117)

## 2012-03-25 LAB — BASIC METABOLIC PANEL
BUN: 71 mg/dL — ABNORMAL HIGH (ref 6–23)
Chloride: 103 mEq/L (ref 96–112)
Creatinine, Ser: 1.87 mg/dL — ABNORMAL HIGH (ref 0.50–1.10)
GFR calc Af Amer: 26 mL/min — ABNORMAL LOW (ref >90)
GFR calc non Af Amer: 22 mL/min — ABNORMAL LOW (ref >90)
Glucose, Bld: 131 mg/dL — ABNORMAL HIGH (ref 70–99)

## 2012-03-25 NOTE — Progress Notes (Signed)
Pharmacist Heart Failure Core Measure Documentation  Assessment: [name] has an EF documented as 30-35% on 03/24/12 by ECHO.  Rationale: Heart failure patients with left ventricular systolic dysfunction (LVSD) and an EF < 40% should be prescribed an angiotensin converting enzyme inhibitor (ACEI) or angiotensin receptor blocker (ARB) at discharge unless a contraindication is documented in the medical record.  This patient is not currently on an ACEI or ARB for HF.  This note is being placed in the record in order to provide documentation that a contraindication to the use of these agents is present for this encounter.  ACE Inhibitor or Angiotensin Receptor Blocker is contraindicated (specify all that apply)  []   ACEI allergy AND ARB allergy []   Angioedema []   Moderate or severe aortic stenosis []   Hyperkalemia []   Hypotension []   Renal artery stenosis [x]   Worsening renal function, preexisting renal disease or dysfunction   Fredrik Rigger 03/25/2012 10:14 AM

## 2012-03-25 NOTE — Progress Notes (Signed)
Dr. Betti Cruz notified by Reginia Forts that Pt's son in room would like update for today.  MD returned call but pt's son no longer in the room at this time.  Amanda Pea, Charity fundraiser.

## 2012-03-25 NOTE — Progress Notes (Signed)
[facility] PROGRESS NOTE  [name] ZOX:[id number] DOB: 1916/12/09 DOA: 03/22/2012 PCP: Kimber Relic, MD  Brief History: [name] is a 79 y.o. female resident of [facility] who was sent to the ED due to complaints of worsening SOB, cough and fevers. She reports that she has been sick for 2 weeks. She was treated with Avelox, and Rocephin at the nursing home but her symptoms continued to worsen. She complained of SOB in the Evening and was found to be hypoxic, she was given a breathing treatment without relief and EMS was called and transported her to the ED. Her Oxygen saturations were 87%. She received another neb treatment and solumedrol and was placed on supplemental O2 on route to the hospital.  Assessment/Plan: Acute hypoxemic respiratory failure/Healthcare associated pneumonia Has not been improving on moxifloxacin and ceftriaxone at [facility].  Chest x-ray shows cardiomegaly and bronchitic changes, bibasilar atelectasis or consolidation.  Continue Solu-Medrol as patient likely has reactive area disease.  Influenza panel negative.  Continue on vancomycin, levofloxacin and cefepime for antibiotic coverage.  Speech therapy consultation to assess patient's swallowing pending.  Continue neb treatments.  Acute renal failure on chronic kidney disease stage IV Baseline renal function in 2012 was 1.06-1.3.  Renal function on admission was 2.10.  Improved, continue to monitor.  Systolic congestive heart failure Patient has elevated JVD on exam with 1+ lower extremity peripherally.  2-D echocardiogram reviewed with results as indicated below.  Not on ACE/ARB due to CKD.  Discontinue oral furosemide, start furosemide 40 mg IV twice daily.  Input and output not accurate as patient is incontinent.  Weight is unchanged from admission.  Continue close monitoring of renal function with diuresis.  Protein calorie malnutrition Encourage supplemental nutrition.  Generalized  weakness Continue PT consultation.  Hypothyroidism Continue Synthroid.  Hypertension Stable.  Continue home antihypertensive medications.  Code Status: DO NOT RESUSCITATE/DO NOT INTUBATE. Family Communication: No family at bedside. Son updated on 03/24/2012 at bedside. Disposition Plan: Pending.  Consultants:  None  Procedures: 2-D echocardiogram on 03/24/2012 Study Conclusions - Left ventricle: The cavity size was normal. There was mild concentric hypertrophy. Systolic function was moderately to severely reduced. The estimated ejection fraction was in the range of 30% to 35%. There is akinesis of the mid-distalanteroseptal and inferoseptal myocardium. No evidence of thrombus. - Aortic valve: Trivial regurgitation. - Mitral valve: Calcified annulus. Mildly thickened leaflets. Mild regurgitation. Impressions: - Compared to the prior study, there has been no significant interval change.  Antibiotics:  Vancomycin 03/23/2012 >>  Levofloxacin 03/23/2012 >>  Cefepime 03/23/2012 >>  HPI/Subjective: Reports breathing about the same as yesterday but slightly better from admission.  Objective: Filed Vitals:   03/25/12 0102 03/25/12 0415 03/25/12 0918 03/25/12 0952  BP:  163/94  171/99  Pulse: 88 84  85  Temp:  98 F (36.7 C)    TempSrc:  Oral    Resp: 20 22    Height:      Weight:  64 kg (141 lb 1.5 oz)    SpO2: 97% 96% 98%     Intake/Output Summary (Last 24 hours) at 03/25/12 0956 Last data filed at 03/25/12 0800  Gross per 24 hour  Intake   1376 ml  Output      2 ml  Net   1374 ml   Filed Weights   03/23/12 0500 03/24/12 0557 03/25/12 0415  Weight: 63.4 kg (139 lb 12.4 oz) 62.5 kg (137 lb 12.6 oz) 64 kg (141 lb  1.5 oz)    Exam: Physical Exam: General: Awake, Oriented, In some distress from shortness of breath. HEENT: EOMI. Neck: Supple CV: S1 and S2 Lungs: Coarse breath sounds bilaterally with scattered wheezing, moderate air movement.  Crackles at bases  bilaterally. Abdomen: Soft, Nontender, Nondistended, +bowel sounds. Ext: Good pulses. Trace edema.  Data Reviewed: Basic Metabolic Panel:  Lab 03/25/12 1478 03/24/12 0944 03/23/12 0558 03/23/12 0103 03/23/12 0057  NA 145 146* 142 144 143  K 4.3 4.1 4.3 4.2 4.3  CL 103 105 102 104 102  CO2 27 26 26  -- 28  GLUCOSE 131* 191* 200* 152* 158*  BUN 71* 60* 54* 54* 53*  CREATININE 1.87* 1.75* 1.74* 2.10* 1.84*  CALCIUM 8.8 8.6 9.0 -- 8.8  MG 2.2 2.2 -- -- --  PHOS -- -- -- -- --   Liver Function Tests: No results found for this basename: AST:5,ALT:5,ALKPHOS:5,BILITOT:5,PROT:5,ALBUMIN:5 in the last 168 hours No results found for this basename: LIPASE:5,AMYLASE:5 in the last 168 hours No results found for this basename: AMMONIA:5 in the last 168 hours CBC:  Lab 03/24/12 0944 03/23/12 0558 03/23/12 0103 03/23/12 0057  WBC 8.2 11.1* -- 11.7*  NEUTROABS -- -- -- 9.5*  HGB 10.0* 10.7* 11.2* 10.5*  HCT 33.1* 34.0* 33.0* 33.7*  MCV 98.5 98.0 -- 97.4  PLT 239 238 -- 241   Cardiac Enzymes: No results found for this basename: CKTOTAL:5,CKMB:5,CKMBINDEX:5,TROPONINI:5 in the last 168 hours BNP (last 3 results) No results found for this basename: PROBNP:3 in the last 8760 hours CBG: No results found for this basename: GLUCAP:5 in the last 168 hours  Recent Results (from the past 240 hour(s))  MRSA PCR SCREENING     Status: Abnormal   Collection Time   03/23/12  3:44 AM      Component Value Range Status Comment   MRSA by PCR POSITIVE (*) NEGATIVE Final      Studies: No results found.  Scheduled Meds:    . albuterol  2.5 mg Nebulization Q6H  . aspirin EC  81 mg Oral Daily  . brimonidine  1 drop Both Eyes BID  . ceFEPime (MAXIPIME) IV  1 g Intravenous Q24H  . Chlorhexidine Gluconate Cloth  6 each Topical Q0600  . Cholecalciferol  1,000 Units Oral BID  . clopidogrel  75 mg Oral Q breakfast  . enoxaparin (LOVENOX) injection  30 mg Subcutaneous Daily  . furosemide  40 mg Intravenous  BID  . ipratropium  0.5 mg Nebulization Q6H  . latanoprost  1 drop Both Eyes QHS  . levofloxacin (LEVAQUIN) IV  250 mg Intravenous Q48H  . levothyroxine  100 mcg Oral QAC breakfast  . methylPREDNISolone (SOLU-MEDROL) injection  40 mg Intravenous Q8H  . metoprolol  37.5 mg Oral BID  . mupirocin ointment  1 application Nasal BID  . pantoprazole  40 mg Oral Q1200  . saccharomyces boulardii  250 mg Oral BID  . senna-docusate  1 tablet Oral BID  . sodium chloride  3 mL Intravenous Q12H  . timolol  1 drop Both Eyes BID  . vancomycin  500 mg Intravenous Q48H   Continuous Infusions:   Principal Problem:  *HCAP (healthcare-associated pneumonia) Active Problems:  HTN (hypertension)  CKD (chronic kidney disease), stage IV  Hypoxemia  Fever  ARF (acute renal failure)  Systolic CHF  Hyperglycemia   Ajit Errico A  [facility] Pager [phone]. If 8PM-8AM, please contact night-coverage at [email or web address], password [facility] 03/25/2012, 9:56 AM  LOS: 3 days

## 2012-03-25 NOTE — Progress Notes (Signed)
Pt's face noted to be more flushed/red around cheeks and forehead.  Dr.  Betti Cruz made aware.  MD instructed to give tylenol.  Pt refusing tylenol.  Oncoming nurse made aware.  Will continue to monitor.  Amanda Pea, Charity fundraiser.

## 2012-03-26 ENCOUNTER — Inpatient Hospital Stay (HOSPITAL_COMMUNITY): Payer: Medicare Other

## 2012-03-26 DIAGNOSIS — R627 Adult failure to thrive: Secondary | ICD-10-CM

## 2012-03-26 LAB — CBC
HCT: 36.6 % (ref 36.0–46.0)
Hemoglobin: 11.5 g/dL — ABNORMAL LOW (ref 12.0–15.0)
MCV: 96.1 fL (ref 78.0–100.0)
RDW: 15.7 % — ABNORMAL HIGH (ref 11.5–15.5)
WBC: 9.1 10*3/uL (ref 4.0–10.5)

## 2012-03-26 LAB — BASIC METABOLIC PANEL
BUN: 73 mg/dL — ABNORMAL HIGH (ref 6–23)
CO2: 33 mEq/L — ABNORMAL HIGH (ref 19–32)
Chloride: 101 mEq/L (ref 96–112)
Creatinine, Ser: 1.91 mg/dL — ABNORMAL HIGH (ref 0.50–1.10)
GFR calc Af Amer: 25 mL/min — ABNORMAL LOW (ref >90)
Glucose, Bld: 109 mg/dL — ABNORMAL HIGH (ref 70–99)
Potassium: 3.4 mEq/L — ABNORMAL LOW (ref 3.5–5.1)

## 2012-03-26 MED ORDER — POTASSIUM CHLORIDE CRYS ER 20 MEQ PO TBCR
40.0000 meq | EXTENDED_RELEASE_TABLET | Freq: Every day | ORAL | Status: DC
  Administered 2012-03-26 – 2012-03-27 (×2): 40 meq via ORAL
  Filled 2012-03-26: qty 2

## 2012-03-26 MED ORDER — METOPROLOL TARTRATE 50 MG PO TABS
50.0000 mg | ORAL_TABLET | Freq: Two times a day (BID) | ORAL | Status: DC
  Administered 2012-03-26 – 2012-03-27 (×2): 50 mg via ORAL
  Filled 2012-03-26 (×3): qty 1

## 2012-03-26 MED ORDER — FUROSEMIDE 40 MG PO TABS
40.0000 mg | ORAL_TABLET | Freq: Every day | ORAL | Status: DC
  Administered 2012-03-26 – 2012-03-27 (×2): 40 mg via ORAL
  Filled 2012-03-26 (×2): qty 1

## 2012-03-26 MED ORDER — LEVOFLOXACIN 250 MG PO TABS: 250.0000 mg | ORAL_TABLET | Freq: Every day | ORAL | Status: DC

## 2012-03-26 MED ORDER — LEVOFLOXACIN 250 MG PO TABS
250.0000 mg | ORAL_TABLET | ORAL | Status: DC
  Administered 2012-03-26: 250 mg via ORAL
  Filled 2012-03-26: qty 1

## 2012-03-26 MED ORDER — POTASSIUM CHLORIDE CRYS ER 20 MEQ PO TBCR
40.0000 meq | EXTENDED_RELEASE_TABLET | Freq: Once | ORAL | Status: AC
  Administered 2012-03-26: 40 meq via ORAL
  Filled 2012-03-26: qty 2

## 2012-03-26 MED ORDER — ALBUTEROL SULFATE (5 MG/ML) 0.5% IN NEBU
2.5000 mg | INHALATION_SOLUTION | Freq: Three times a day (TID) | RESPIRATORY_TRACT | Status: DC
  Administered 2012-03-27: 2.5 mg via RESPIRATORY_TRACT
  Filled 2012-03-26 (×2): qty 0.5

## 2012-03-26 MED ORDER — IPRATROPIUM BROMIDE 0.02 % IN SOLN
0.5000 mg | Freq: Three times a day (TID) | RESPIRATORY_TRACT | Status: DC
  Administered 2012-03-27: 0.5 mg via RESPIRATORY_TRACT
  Filled 2012-03-26 (×2): qty 2.5

## 2012-03-26 NOTE — Progress Notes (Signed)
[facility]                                                                                Patient Demographics  [name], is a 79 y.o. female  ZOX:[id number]  [id number]:[id number]  DOB - Jun 03, 1916  Admit date - 03/22/2012  Admitting Physician Ron Parker, MD  Outpatient Primary MD for the patient is GREEN, Lenon Curt, MD  LOS - 4   Chief Complaint  Patient presents with  . Respiratory Distress        Assessment & Plan    Brief History:  [name] is a 79 y.o. female resident of [facility] who was sent to the ED due to complaints of worsening SOB, cough and fevers. She reports that she has been sick for 2 weeks. She was treated with Avelox, and Rocephin at the nursing home but her symptoms continued to worsen. She complained of SOB in the Evening and was found to be hypoxic, she was given a breathing treatment without relief and EMS was called and transported her to the ED. Her Oxygen saturations were 87%. She received another neb treatment and solumedrol and was placed on supplemental O2 on route to the hospital.  Assessment/Plan:    Acute hypoxemic respiratory failure/Healthcare associated pneumonia along with acute on chronic systolic heart failure  Much improved on IV antibiotics for HCAP, clinically close to baseline, we'll taper down to only Levaquin oral, nebulizer oxygen treatment as needed, stop IV steroids as there is no wheezing at all on exam.  For her acute on chronic systolic heart failure -  2-D echocardiogram reviewed EF is 30-35%, she is on beta blocker, oral Lasix, fluid restriction Not on ACE/ARB due to CKD, since she is incontinent intake and output not accurate. Replace potassium and monitor BMP along with magnesium closely.    Acute renal failure on chronic kidney disease stage IV  Baseline renal function in 2012 was 1.06-1.3. Renal function on admission was 2.10. Improved, continue to monitor.   Protein calorie malnutrition    Encourage supplemental nutrition.    Generalized weakness  Continue PT consultation.    Hypothyroidism  Continue Synthroid.    Hypertension  Have increased beta blocker for better control. Other home medications unchanged   Procedures:   2-D echocardiogram on 03/24/2012  Study Conclusions - Left ventricle: The cavity size was normal. There was mild concentric hypertrophy. Systolic function was moderately to severely reduced. The estimated ejection fraction was in the range of 30% to 35%. There is akinesis of the mid-distalanteroseptal and inferoseptal myocardium. No evidence of thrombus. - Aortic valve: Trivial regurgitation. - Mitral valve: Calcified annulus. Mildly thickened leaflets. Mild regurgitation. Impressions: - Compared to the prior study, there has been no significant interval change.     Code Status: DNR  Family Communication: Patient and son  Disposition Plan: Nursing home   Procedures echocardiogram   Consults  none   DVT Prophylaxis  Lovenox   Lab Results  Component Value Date   PLT 280 03/26/2012    Medications  Scheduled Meds:    . albuterol  2.5 mg Nebulization Q6H  . aspirin EC  81 mg Oral Daily  .  brimonidine  1 drop Both Eyes BID  . Chlorhexidine Gluconate Cloth  6 each Topical Q0600  . Cholecalciferol  1,000 Units Oral BID  . clopidogrel  75 mg Oral Q breakfast  . enoxaparin (LOVENOX) injection  30 mg Subcutaneous Daily  . furosemide  40 mg Oral Daily  . ipratropium  0.5 mg Nebulization Q6H  . latanoprost  1 drop Both Eyes QHS  . levofloxacin  250 mg Oral Daily  . levothyroxine  100 mcg Oral QAC breakfast  . metoprolol  50 mg Oral BID  . mupirocin ointment  1 application Nasal BID  . pantoprazole  40 mg Oral Q1200  . potassium chloride  40 mEq Oral Daily  . saccharomyces boulardii  250 mg Oral BID  . senna-docusate  1 tablet Oral BID  . sodium chloride  3 mL Intravenous Q12H  . timolol  1 drop Both Eyes BID   Continuous  Infusions:  PRN Meds:.acetaminophen, acetaminophen, albuterol, alum & mag hydroxide-simeth, bisacodyl, guaiFENesin, HYDROmorphone (DILAUDID) injection, nitroGLYCERIN, ondansetron (ZOFRAN) IV, ondansetron, oxyCODONE, zolpidem  Antibiotics     Anti-infectives     Start     Dose/Rate Route Frequency Ordered Stop   03/26/12 1200   levofloxacin (LEVAQUIN) tablet 250 mg        250 mg Oral Daily 03/26/12 1146     03/24/12 2200   Levofloxacin (LEVAQUIN) IVPB 250 mg  Status:  Discontinued        250 mg 50 mL/hr over 60 Minutes Intravenous Every 48 hours 03/23/12 0323 03/26/12 1146   03/24/12 1800   vancomycin (VANCOCIN) 500 mg in sodium chloride 0.9 % 100 mL IVPB  Status:  Discontinued        500 mg 100 mL/hr over 60 Minutes Intravenous Every 48 hours 03/23/12 0323 03/26/12 1146   03/24/12 0800   levofloxacin (LEVAQUIN) IVPB 500 mg  Status:  Discontinued        500 mg 100 mL/hr over 60 Minutes Intravenous Every 24 hours 03/23/12 0205 03/23/12 0226   03/23/12 1100   ceFEPIme (MAXIPIME) 1 g in dextrose 5 % 50 mL IVPB  Status:  Discontinued        1 g 100 mL/hr over 30 Minutes Intravenous Every 24 hours 03/23/12 1014 03/26/12 1146   03/23/12 0130   levofloxacin (LEVAQUIN) IVPB 500 mg        500 mg 100 mL/hr over 60 Minutes Intravenous  Once 03/23/12 0127 03/23/12 0300   03/23/12 0130   vancomycin (VANCOCIN) IVPB 1000 mg/200 mL premix        1,000 mg 200 mL/hr over 60 Minutes Intravenous  Once 03/23/12 0127 03/23/12 0448           Time Spent in minutes   35   [name] K M.D on 03/26/2012 at 11:46 AM  Between 7am to 7pm - Pager - [phone]  After 7pm go to [email or web address] - password [email or web address]  And look for the night coverage person covering for me after hours  [facility] Office  [phone]    Subjective:   [name] today has, No headache, No chest pain, No abdominal pain - No Nausea, No new weakness tingling or numbness, improved Cough & SOB.    Objective:   Filed Vitals:   03/26/12 0118 03/26/12 0532 03/26/12 0926 03/26/12 1105  BP:  176/96  149/77  Pulse: 79 80  77  Temp:  98.4 F (36.9 C)    TempSrc:  Oral    Resp: 18 20  18  Height:      Weight:  61.3 kg (135 lb 2.3 oz)    SpO2: 96% 98% 95% 95%    Wt Readings from Last 3 Encounters:  03/26/12 61.3 kg (135 lb 2.3 oz)  01/23/11 52 kg (114 lb 10.2 oz)  01/15/11 53.7 kg (118 lb 6.2 oz)     Intake/Output Summary (Last 24 hours) at 03/26/12 1146 Last data filed at 03/26/12 0933  Gross per 24 hour  Intake    580 ml  Output      0 ml  Net    580 ml    Exam Awake Alert, Oriented X 3, No new F.N deficits, Normal affect [place].AT,PERRAL Supple Neck,No JVD, No cervical lymphadenopathy appriciated.  Symmetrical Chest wall movement, Good air movement bilaterally, CTAB RRR,No Gallops,Rubs or new Murmurs, No Parasternal Heave +ve B.Sounds, Abd Soft, Non tender, No organomegaly appriciated, No rebound - guarding or rigidity. No Cyanosis, Clubbing or edema, No new Rash or bruise     Data Review   Micro Results Recent Results (from the past 240 hour(s))  MRSA PCR SCREENING     Status: Abnormal   Collection Time   03/23/12  3:44 AM      Component Value Range Status Comment   MRSA by PCR POSITIVE (*) NEGATIVE Final     Radiology Reports Dg Chest Port 1 View  03/26/2012  *RADIOLOGY REPORT*  Clinical Data: Short of breath  PORTABLE CHEST - 1 VIEW  Comparison: Prior chest x-ray 03/23/2012  Findings: Similar very low inspiratory volumes with increasing slightly asymmetric left greater than right perihilar opacities and retrocardiac opacities.  Probable small left pleural effusion. Stable cardiomegaly and aortic athero sclerotic calcifications.  No acute osseous abnormality.  IMPRESSION:  1.  Slightly increased bilateral left greater than right perihilar and retrocardiac opacities favored to reflect developing asymmetric pulmonary edema.  Consolidation difficult to exclude in  the left base. 2.  Small left pleural effusion 3.  Stable cardiomegaly   Original Report Authenticated By: Malachy Moan, M.D.    Dg Chest Port 1 View  03/23/2012  *RADIOLOGY REPORT*  Clinical Data: Pain, respiratory distress, fever.  PORTABLE CHEST - 1 VIEW  Comparison: 01/22/2011  Findings: Cardiomegaly.  Mild peribronchial thickening.  Bibasilar atelectasis or infiltrates.  Elevation of the right hemidiaphragm. No visible effusions or acute bony abnormality.  IMPRESSION: Cardiomegaly, bronchitic changes.  Bibasilar atelectasis or consolidation.   Original Report Authenticated By: Charlett Nose, M.D.     CBC  Lab 03/26/12 0600 03/24/12 0944 03/23/12 0558 03/23/12 0103 03/23/12 0057  WBC 9.1 8.2 11.1* -- 11.7*  HGB 11.5* 10.0* 10.7* 11.2* 10.5*  HCT 36.6 33.1* 34.0* 33.0* 33.7*  PLT 280 239 238 -- 241  MCV 96.1 98.5 98.0 -- 97.4  MCH 30.2 29.8 30.8 -- 30.3  MCHC 31.4 30.2 31.5 -- 31.2  RDW 15.7* 16.1* 16.1* -- 15.9*  LYMPHSABS -- -- -- -- 0.9  MONOABS -- -- -- -- 1.2*  EOSABS -- -- -- -- 0.1  BASOSABS -- -- -- -- 0.0  BANDABS -- -- -- -- --    Chemistries   Lab 03/26/12 0600 03/25/12 0545 03/24/12 0944 03/23/12 0558 03/23/12 0103 03/23/12 0057  NA 145 145 146* 142 144 --  K 3.4* 4.3 4.1 4.3 4.2 --  CL 101 103 105 102 104 --  CO2 33* 27 26 26  -- 28  GLUCOSE 109* 131* 191* 200* 152* --  BUN 73* 71* 60* 54* 54* --  CREATININE 1.91*  1.87* 1.75* 1.74* 2.10* --  CALCIUM 9.2 8.8 8.6 9.0 -- 8.8  MG 2.2 2.2 2.2 -- -- --  AST -- -- -- -- -- --  ALT -- -- -- -- -- --  ALKPHOS -- -- -- -- -- --  BILITOT -- -- -- -- -- --   ------------------------------------------------------------------------------------------------------------------ estimated creatinine clearance is 17.2 ml/min (by C-G formula based on Cr of 1.91). ------------------------------------------------------------------------------------------------------------------  [facility] 03/25/12 0545  HGBA1C 6.1*    ------------------------------------------------------------------------------------------------------------------ No results found for this basename: CHOL:2,HDL:2,LDLCALC:2,TRIG:2,CHOLHDL:2,LDLDIRECT:2 in the last 72 hours ------------------------------------------------------------------------------------------------------------------ No results found for this basename: TSH,T4TOTAL,FREET3,T3FREE,THYROIDAB in the last 72 hours ------------------------------------------------------------------------------------------------------------------ No results found for this basename: VITAMINB12:2,FOLATE:2,FERRITIN:2,TIBC:2,IRON:2,RETICCTPCT:2 in the last 72 hours  Coagulation profile No results found for this basename: INR:5,PROTIME:5 in the last 168 hours  No results found for this basename: DDIMER:2 in the last 72 hours  Cardiac Enzymes No results found for this basename: CK:3,CKMB:3,TROPONINI:3,MYOGLOBIN:3 in the last 168 hours ------------------------------------------------------------------------------------------------------------------ No components found with this basename: POCBNP:3

## 2012-03-26 NOTE — Evaluation (Signed)
Clinical/Bedside Swallow Evaluation Patient Details  Name: [name] MRN: [id number] Date of Birth: 03/26/1916  Today's Date: 03/26/2012 Time: 1500-1530 SLP Time Calculation (min): 30 min  Past Medical History:  Past Medical History  Diagnosis Date  . Hypothyroidism   . Depression   . Migraines   . Dental pulp degeneration   . Corns and callus   . Gait disorder   . Urinary incontinence   . Skin cancer   . Cataract   . Skin cancer   . Insomnia    Past Surgical History:  Past Surgical History  Procedure Date  . Total hip arthroplasty   . Cataract extraction   . Abdominal hysterectomy    HPI:  Patient is a 79 y.o. female resident of [facility] who was sent to the ED due to complaints of worsening SOB, cough and fevers. BSE ordered by MD to determine patient's current swallow function and to make recommendations for least restrictive and safest oral diet.   Assessment / Plan / Recommendation Clinical Impression  Per this BSE, patient's oral and pharyngeal swallow function appear to be [facility], with no overt s/s aspiration or difficulty with swallowing noted, for all tested boluses (thin liquids; straw and cup sip, regular solids, puree solids). Patient exhibited a dry cough (question bronchial?) that was present prior to and after intake of P.O.'s. Patient was a good historian, and stated that she has had that cough "for a while".      Aspiration Risk  Mild    Diet Recommendation Regular;Thin liquid   Liquid Administration via: Cup;Straw Medication Administration: Whole meds with liquid Supervision: Patient able to self feed Compensations: Follow solids with liquid    Other  Recommendations Oral Care Recommendations: Oral care BID   Follow Up Recommendations  None    Frequency and Duration     N/A   Pertinent Vitals/Pain Patient c/o of mild lower back pain, RN already aware. No changes in vitals observed during this assessment    SLP Swallow Goals    Swallow  Study Prior Functional Status       General Date of Onset: 03/22/12 HPI: Patient is a 79 y.o. female resident of [facility] who was sent to the ED due to complaints of worsening SOB, cough and fevers. BSE ordered by MD to determine patient's current swallow function and to make recommendations for least restrictive and safest oral diet. Type of Study: Bedside swallow evaluation Previous Swallow Assessment: N/A Diet Prior to this Study: Regular;Thin liquids Temperature Spikes Noted: No Respiratory Status: Room air History of Recent Intubation: No Behavior/Cognition: Cooperative;Pleasant mood;Alert Oral Cavity - Dentition: Missing dentition;Adequate natural dentition Self-Feeding Abilities: Able to feed self;Needs set up Patient Positioning: Upright in bed Baseline Vocal Quality: Clear Volitional Cough: Strong Volitional Swallow: Able to elicit    Oral/Motor/Sensory Function Overall Oral Motor/Sensory Function: Appears within functional limits for tasks assessed   Ice Chips Ice chips: Within functional limits   Thin Liquid Thin Liquid: Within functional limits Presentation: Self Fed;Cup;Straw    Nectar Thick Nectar Thick Liquid: Not tested   Honey Thick Honey Thick Liquid: Not tested   Puree Puree: Within functional limits Presentation: Self Fed;Spoon   Solid   Solid: Within functional limits Presentation: Self Mattie Marlin 03/26/2012,6:22 PM   Angela Nevin, MA, CCC-SLP [facility] Speech-Language Pathologist

## 2012-03-26 NOTE — Clinical Social Work Psychosocial (Addendum)
    Clinical Social Work Department BRIEF PSYCHOSOCIAL ASSESSMENT 03/26/2012  Patient:  [name], [name]     Account Number:  [id number]     Admit date:  03/22/2012  Clinical Social Worker:  Tiburcio Pea  Date/Time:  03/24/2012 07:00 PM  Referred by:  Physician  Date Referred:  03/23/2012 Referred for  SNF Placement   Other Referral:   Return to SNF   Interview type:  Patient Other interview type:   Attempting to reach son/family    PSYCHOSOCIAL DATA Living Status:  FACILITY Admitted from facility:  [facility] Level of care:  Skilled Nursing Facility Primary support name:  [name]   [phone] Primary support relationship to patient:  CHILD, ADULT Degree of support available:   Strong support    CURRENT CONCERNS Current Concerns  Post-Acute Placement   Other Concerns:   Return to SNF    SOCIAL WORK ASSESSMENT / PLAN Resident of [facility] [place]- at skilled level. Plan return to facility when medically stable. Patient wants to return there at d/c.  Will discuss with [name]- Admisssions Director [facility].  Fl2 to be placed on chart for MD"s signature.   Assessment/plan status:  Psychosocial Support/Ongoing Assessment of Needs Other assessment/ plan:   Information/referral to community resources:   None at this time    PATIENT'S/FAMILY'S RESPONSE TO PLAN OF CARE: Patient is alert to person and place; she states that she wants to return to [facility] at d/c. She states "they are good to me."  CSW will assist with return to facility when stable. Attempting to reach pt's family -son regarding above.

## 2012-03-26 NOTE — Clinical Documentation Improvement (Signed)
CHF DOCUMENTATION CLARIFICATION QUERY  THIS DOCUMENT IS NOT A PERMANENT PART OF THE MEDICAL RECORD   Please update your documentation within the medical record to reflect your response to this query.                                                                                     03/26/12  Dr. Thedore Mins and/or Associates,  In a better effort to capture your patient's severity of illness, reflect appropriate length of stay and utilization of resources, a review of the patient medical record has revealed the following indicators the diagnosis of Heart Failure:  Known history of Systolic Heart Failure  "Systolic Congestive Heart Failure Patient has elevated JVD on exam with 1+ lower extremity peripherally. 2-D echocardiogram reviewed with results as indicated below. Not on ACE/ARB due to CKD. Discontinue oral furosemide, start furosemide 40 mg IV twice daily." REDDY,SRIKAR A  03/24/2012, 11:04 AM LOS: 2 days Progress Notes   03/24/12   Echo Study Conclusions - Left ventricle: The cavity size was normal. There was mild concentric hypertrophy. Systolic function was moderately to severely reduced. The estimated ejection fraction was in the range of 30% to 35%. There is akinesis of the mid-distalanteroseptal and inferoseptal myocardium. No evidence of thrombus. - Aortic valve: Trivial regurgitation. - Mitral valve: Calcified annulus. Mildly thickened leaflets . Mild regurgitation. Impressions: - Compared to the prior study, there has been no significant interval change.  "Systolic Congestive heart Failure Patient has elevated JVD on exam with 1+ lower extremity peripherally. 2-D echocardiogram reviewed with results as indicated below. Not on ACE/ARB due to CKD. Discontinue oral furosemide, start furosemide 40 mg IV twice daily. Input and output not accurate as patient is incontinent. Weight is unchanged from admission. Continue close monitoring of renal function with diuresis." REDDY,SRIKAR A   03/25/2012, 9:56 AM LOS: 3 days  Progress Notes   Based on your clinical judgment, please document the ACUITY of the Systolic Heart Failure monitored and treated this admission in the progress notes and discharge summary:  ACUITY:  - Acute  - Acute on Chronic  - Chronic   In responding to this query please exercise your independent judgment.    The fact that a query is asked, does not imply that any particular answer is desired or expected.   Reviewed:  no additional documentation provided, all in my note  Thank You,  Jerral Ralph  RN BSN CCDS Certified Clinical Documentation Specialist: Cell   [phone]  Health Information Management [place]   TO RESPOND TO THE THIS QUERY, FOLLOW THE INSTRUCTIONS BELOW:  1. If needed, update documentation for the patient's encounter via the notes activity.  2. Access this query again and click edit on the In [employer].  3. After updating, or not, click F2 to complete all highlighted (required) fields concerning your review. Select "additional documentation in the medical record" OR "no additional documentation provided".  4. Click Sign note button.  5. The deficiency will fall out of your In Basket *Please let us know if you are not able to complete this workflow by phone or e-mail (listed below).

## 2012-03-26 NOTE — Progress Notes (Addendum)
ANTIBIOTIC CONSULT NOTE - FOLLOW UP  Pharmacy Consult for Vancomycin + Cefepime Indication: HCAP  Allergies  Allergen Reactions  . Anaprox (Naproxen Sodium)     Unknown reaction    Patient Measurements: Height: 5\' 6"  (167.6 cm) Weight: 135 lb 2.3 oz (61.3 kg) IBW/kg (Calculated) : 59.3   Vital Signs: Temp: 98.4 F (36.9 C) (01/15 0532) Temp src: Oral (01/15 0532) BP: 176/96 mmHg (01/15 0532) Pulse Rate: 80  (01/15 0532) Intake/Output from previous day: 01/14 0701 - 01/15 0700 In: 800 [P.O.:700; IV Piggyback:100] Out: -  Intake/Output from this shift: Total I/O In: 120 [P.O.:120] Out: -   Labs:  Basename 03/26/12 0600 03/25/12 0545 03/24/12 0944  WBC 9.1 -- 8.2  HGB 11.5* -- 10.0*  PLT 280 -- 239  LABCREA -- -- --  CREATININE 1.91* 1.87* 1.75*   Estimated Creatinine Clearance: 17.2 ml/min (by C-G formula based on Cr of 1.91).  Assessment: 89yof continues on day#4 antibiotics for HCAP. Renal function improved from the time of antibiotic initiation, however, sCr appears to be trending back up again. Levaquin and Cefepime doses remain appropriate. Vancomycin not at steady state so will hold off on trough. Could increase the dose based on the dosing nomogram but given age and ARF will leave at current dose.  1/12 Levaquin>> 1/12 Vanc >> 1/12 Cefepime>> 1/12 MRSA PCR positive  Goal of Therapy:  Appropriate Levaquin/Cefepime dosing Vancomycin trough 15-20  Plan:  1) Continue Levaquin 250mg  IV q48 2) Continue Cefepime 1g IV q24 3) Continue Vancomycin 500mg  IV q48 - plan for trough 1/17 prior to 1800 dose  Fredrik Rigger 03/26/2012,9:57 AM   Vancomycin and Cefepime d/c'ed and Levaquin ordered per pharmacy. Was on levaquin 250mg  IV q48 (after receiving 500mg  load) so will keep same dose and switch to PO.  Fredrik Rigger 03/26/2012, 12:03 PM

## 2012-03-26 NOTE — Progress Notes (Addendum)
Per [facility]- Bed will be available for patient at d/c. ? D/c tomorrow per MD.  Message left for pt's son Brynda Greathouse to call back re: above.  Pt's nurse- Marchelle Folks notified.  Lorri Frederick. [place]  [phone]

## 2012-03-27 LAB — BASIC METABOLIC PANEL
CO2: 30 mEq/L (ref 19–32)
Chloride: 102 mEq/L (ref 96–112)
Glucose, Bld: 101 mg/dL — ABNORMAL HIGH (ref 70–99)
Potassium: 4.2 mEq/L (ref 3.5–5.1)
Sodium: 144 mEq/L (ref 135–145)

## 2012-03-27 MED ORDER — LEVOFLOXACIN 250 MG PO TABS: 250.0000 mg | ORAL_TABLET | ORAL | Status: DC

## 2012-03-27 MED ORDER — POTASSIUM CHLORIDE CRYS ER 20 MEQ PO TBCR: 20.0000 meq | EXTENDED_RELEASE_TABLET | Freq: Two times a day (BID) | ORAL | Status: DC

## 2012-03-27 NOTE — Progress Notes (Signed)
Pt d/c to [facility] via ambulance.  Son got d/c papers.Amanda Pea, RN.

## 2012-03-27 NOTE — Progress Notes (Signed)
PT Cancellation Note  Patient Details Name: [name] MRN: [id number] DOB: September 01, 1916   Cancelled Treatment:    Reason Eval/Treat Not Completed: Other (comment) (pt with planned DC today awaiting ambulance transport.) Defer to SNF  [facility], PT [phone]

## 2012-03-27 NOTE — Progress Notes (Signed)
Per MD- patient is stable for d/c today back to [facility].  Ok per patient and message left for pt's son who is aware of d/c per nursing. Patient will require PRN 02.  Attempting to reach [place]- Admissions at [facility]- she will be in a meeting until 12:00 but is aware of planned d/c today. Once Wille Celeste has been notified- will call EMS. Pt's nurse- Nellie is aware of above.  No further CSW needs identified.  Lorri Frederick. [place]  [phone]

## 2012-03-27 NOTE — Discharge Summary (Addendum)
[facility]                                                                                   [name], is a 79 y.o. female  DOB 02/11/1917  MRN [id number].  Admission date:  03/22/2012  Discharge Date:  03/27/2012  Primary MD  Kimber Relic, MD  Admitting Physician  Ron Parker, MD  Admission Diagnosis  Hypoxemia [799.02] HTN (hypertension) [401.9] ARF (acute renal failure) [584.9] Anemia [285.9] CKD (chronic kidney disease), stage IV [585.4] Fever [780.60] Systolic CHF [428.20] HCAP (healthcare-associated pneumonia) [486] Sepsis [038.9, 995.91] HCAP healthcare-associated pneumonia  Discharge Diagnosis     Principal Problem:  *HCAP (healthcare-associated pneumonia) Active Problems:  HTN (hypertension)  CKD (chronic kidney disease), stage IV  Hypoxemia  Fever  ARF (acute renal failure)  Systolic CHF  Hyperglycemia    Past Medical History  Diagnosis Date  . Hypothyroidism   . Depression   . Migraines   . Dental pulp degeneration   . Corns and callus   . Gait disorder   . Urinary incontinence   . Skin cancer   . Cataract   . Skin cancer   . Insomnia     Past Surgical History  Procedure Date  . Total hip arthroplasty   . Cataract extraction   . Abdominal hysterectomy      Recommendations for primary care physician for things to follow:   Please follow patient's weight and BMP closely adjust Lasix and potassium dose.  Please have her follow with her primary cardiologist in one to 2 weeks   Discharge Diagnoses:   Principal Problem:  *HCAP (healthcare-associated pneumonia) Active Problems:  HTN (hypertension)  CKD (chronic kidney disease), stage IV  Hypoxemia  Fever  ARF (acute renal failure)  Systolic CHF  Hyperglycemia    Discharge Condition: Stable   Diet recommendation: See Discharge Instructions below   Consults None   History of present illness and  Hospital Course:  See H&P, Labs, Consult and  Test reports for all details in brief, patient was admitted from [facility] place nursing home due to healthcare associated pneumonia along with acute on chronic systolic heart failure causing shortness of breath along with fevers and cough. She was treated here with empiric IV antibiotics along with IV Lasix with good results, she is now gradually improving and has been transitioned to oral antibiotics and oral Lasix, she will continue to get 2 L nasal cannula oxygen which can be titrated to keep pulse ox over 90%, when necessary nebulizer treatments for shortness of breath, patient should follow fluid restriction low sodium diet and follow with her primary cardiologist one to 2 weeks, will request primary care physician to please monitor patient's weight, BMP, Lasix along with potassium supplement dose closely.    Patient has underlying chronic kidney disease stage IV with baseline creatinine around 1.3, she did have acute renal failure due to acute decompensation of heart failure and creatinine had bumped to 2.1, her renal insufficiency prevents [place] from placing her on ACE/ARB. Renal function is improving please monitor weight and BMP closely along with her potassium supplement dose.   She also has evidence  of protein calorie malnutrition for which she should be seen by nutritionist at the nursing facility and appropriate protein supplementation should be provided. She does have generalized weakness and deconditioning due to advanced age for which physical therapy as tolerated should be considered. Her home dose Synthroid will be continued for her underlying hypothyroidism.     Today   Subjective:   [name] today has no headache,no chest abdominal pain,no new weakness tingling or numbness, feels much better.  Objective:   Blood pressure 157/69, pulse 79, temperature 98.2 F (36.8 C), temperature source Oral, resp. rate 20, height 5\' 6"  (1.676 m), weight 63.2 kg (139 lb 5.3 oz), SpO2  92.00%.   Intake/Output Summary (Last 24 hours) at 03/27/12 1015 Last data filed at 03/27/12 0850  Gross per 24 hour  Intake    480 ml  Output      0 ml  Net    480 ml    Exam Awake Alert,  No new F.N deficits, Normal affect [place].AT,PERRAL Supple Neck,No JVD, No cervical lymphadenopathy appriciated.  Symmetrical Chest wall movement, Good air movement bilaterally, few bile basilar rales RRR,No Gallops,Rubs or new Murmurs, No Parasternal Heave +ve B.Sounds, Abd Soft, Non tender, No organomegaly appriciated, No rebound -guarding or rigidity. No Cyanosis, Clubbing, trace edema, No new Rash or bruise  Data Review   Major procedures and Radiology Reports - PLEASE review detailed and final reports for all details in brief -     Echo  - Left ventricle: The cavity size was normal. There was mild concentric hypertrophy. Systolic function was moderately to severely reduced. The estimated ejection fraction was in the range of 30% to 35%. There is akinesis of the mid-distalanteroseptal and inferoseptal myocardium. No evidence of thrombus. - Aortic valve: Trivial regurgitation. - Mitral valve: Calcified annulus. Mildly thickened leaflets . Mild regurgitation.  Impressions:  - Compared to the prior study, there has been no significant interval change.     Dg Chest Port 1 View  03/26/2012  *RADIOLOGY REPORT*  Clinical Data: Short of breath  PORTABLE CHEST - 1 VIEW  Comparison: Prior chest x-ray 03/23/2012  Findings: Similar very low inspiratory volumes with increasing slightly asymmetric left greater than right perihilar opacities and retrocardiac opacities.  Probable small left pleural effusion. Stable cardiomegaly and aortic athero sclerotic calcifications.  No acute osseous abnormality.  IMPRESSION:  1.  Slightly increased bilateral left greater than right perihilar and retrocardiac opacities favored to reflect developing asymmetric pulmonary edema.  Consolidation difficult to exclude in the  left base. 2.  Small left pleural effusion 3.  Stable cardiomegaly   Original Report Authenticated By: Malachy Moan, M.D.    Dg Chest Port 1 View  03/23/2012  *RADIOLOGY REPORT*  Clinical Data: Pain, respiratory distress, fever.  PORTABLE CHEST - 1 VIEW  Comparison: 01/22/2011  Findings: Cardiomegaly.  Mild peribronchial thickening.  Bibasilar atelectasis or infiltrates.  Elevation of the right hemidiaphragm. No visible effusions or acute bony abnormality.  IMPRESSION: Cardiomegaly, bronchitic changes.  Bibasilar atelectasis or consolidation.   Original Report Authenticated By: Charlett Nose, M.D.     Micro Results      Recent Results (from the past 240 hour(s))  MRSA PCR SCREENING     Status: Abnormal   Collection Time   03/23/12  3:44 AM      Component Value Range Status Comment   MRSA by PCR POSITIVE (*) NEGATIVE Final      CBC w Diff: Lab Results  Component Value Date  WBC 9.1 03/26/2012   HGB 11.5* 03/26/2012   HCT 36.6 03/26/2012   PLT 280 03/26/2012   LYMPHOPCT 8* 03/23/2012   MONOPCT 10 03/23/2012   EOSPCT 1 03/23/2012   BASOPCT 0 03/23/2012    CMP: Lab Results  Component Value Date   NA 144 03/27/2012   K 4.2 03/27/2012   CL 102 03/27/2012   CO2 30 03/27/2012   BUN 79* 03/27/2012   CREATININE 1.97* 03/27/2012   PROT 6.4 01/07/2011   ALBUMIN 3.1* 01/07/2011   BILITOT 0.4 01/07/2011   ALKPHOS 84 01/07/2011   AST 16 01/07/2011   ALT 14 01/07/2011  .   Discharge Instructions     Follow with Primary MD GREEN, Lenon Curt, MD in 3-4 days   Get CBC, CMP, checked 3-4 days by Primary MD. Get a 2 view Chest X ray done next visit.  Get Medicines reviewed and adjusted.  Please request your Prim.MD to go over all Hospital Tests and Procedure/Radiological results at the follow up, please get all Hospital records sent to your Prim MD by signing hospital release before you go home.  Activity: As tolerated with Full fall precautions use walker/cane & assistance as needed   Diet:   Heart healthy,  Fluid restriction 1.8 lit/day, Aspiration precautions.  Check your Weight same time everyday, if you gain over 2 pounds, or you develop in leg swelling, experience more shortness of breath or chest pain, call your Primary MD immediately. Follow Cardiac Low Salt Diet and 1.8 lit/day fluid restriction.  Disposition SNF  If you experience worsening of your admission symptoms, develop shortness of breath, life threatening emergency, suicidal or homicidal thoughts you must seek medical attention immediately by calling 911 or calling your MD immediately  if symptoms less severe.  You Must read complete instructions/literature along with all the possible adverse reactions/side effects for all the Medicines you take and that have been prescribed to you. Take any new Medicines after you have completely understood and accpet all the possible adverse reactions/side effects.   Do not drive and provide baby sitting services if your were admitted for syncope or siezures until you have seen by Primary MD or a Neurologist and advised to do so again.  Do not drive when taking Pain medications.    Do not take more than prescribed Pain, Sleep and Anxiety Medications  Special Instructions: If you have smoked or chewed Tobacco  in the last 2 yrs please stop smoking, stop any regular Alcohol  and or any Recreational drug use.  Wear Seat belts while driving.   Follow-up Information    Follow up with GREEN, Lenon Curt, MD. Schedule an appointment as soon as possible for a visit in 3 days.   Contact information:   [address] [place] [address] [phone]            Discharge Medications     Medication List     As of 03/27/2012 10:15 AM    START taking these medications         levofloxacin 250 MG tablet   Commonly known as: LEVAQUIN   Take 1 tablet (250 mg total) by mouth every other day. For 4 more doses      potassium chloride SA 20 MEQ tablet   Commonly known as:  K-DUR,KLOR-CON   Take 1 tablet (20 mEq total) by mouth 2 (two) times daily.      CONTINUE taking these medications  acetaminophen 500 MG tablet   Commonly known as: TYLENOL      albuterol (5 MG/ML) 0.5% nebulizer solution   Commonly known as: PROVENTIL      aspirin EC 81 MG tablet      bisacodyl 5 MG EC tablet   Commonly known as: DULCOLAX      brimonidine-timolol 0.2-0.5 % ophthalmic solution   Commonly known as: COMBIGAN      Cholecalciferol 1000 UNITS tablet      clopidogrel 75 MG tablet   Commonly known as: PLAVIX      DUONEB IN      furosemide 20 MG tablet   Commonly known as: LASIX   Take 2 tablets (40 mg total) by mouth 2 (two) times daily.      guaiFENesin 600 MG 12 hr tablet   Commonly known as: MUCINEX      latanoprost 0.005 % ophthalmic solution   Commonly known as: XALATAN      levothyroxine 100 MCG tablet   Commonly known as: SYNTHROID, LEVOTHROID      metoprolol 50 MG tablet   Commonly known as: LOPRESSOR      nitroGLYCERIN 0.4 MG SL tablet   Commonly known as: NITROSTAT      pantoprazole 40 MG tablet   Commonly known as: PROTONIX      PRESERVISION AREDS 2 PO      saccharomyces boulardii 250 MG capsule   Commonly known as: FLORASTOR      senna-docusate 8.6-50 MG per tablet   Commonly known as: Senokot-S      STOP taking these medications         dextrose 5 % SOLN 50 mL with cefTRIAXone 1 G SOLR 1 g      moxifloxacin 400 MG tablet   Commonly known as: AVELOX          Where to get your medications       Information on where to get these meds is not yet available. Ask your nurse or doctor.         levofloxacin 250 MG tablet   potassium chloride SA 20 MEQ tablet               Total Time in preparing paper work, data evaluation and todays exam - 35 minutes  Leroy Sea M.D on 03/27/2012 at 10:15 AM  [facility] Office  [phone]

## 2012-03-27 NOTE — Progress Notes (Signed)
Report called to [facility] at [facility] - Skilled care.  Verbalized understanding.  Pt awaiting ambulance for transport.  D/C instructions given to pt's son Harvie Heck per Roe Rutherford, Charity fundraiser.   Amanda Pea, Charity fundraiser.

## 2012-04-01 NOTE — Progress Notes (Signed)
Utilization Review Completed.   Orelia Brandstetter, RN, BSN Nurse Case Manager  [phone]  

## 2012-05-26 LAB — CBC AND DIFFERENTIAL
HCT: 34 % — AB (ref 36–46)
Hemoglobin: 11.2 g/dL — AB (ref 12.0–16.0)
Platelets: 247 10*3/uL (ref 150–399)
WBC: 6.3 10^3/mL

## 2012-05-27 ENCOUNTER — Encounter: Payer: Self-pay | Admitting: Nurse Practitioner

## 2012-05-27 ENCOUNTER — Non-Acute Institutional Stay (SKILLED_NURSING_FACILITY): Payer: Medicare Other | Admitting: Nurse Practitioner

## 2012-05-27 DIAGNOSIS — E039 Hypothyroidism, unspecified: Secondary | ICD-10-CM

## 2012-05-27 NOTE — Progress Notes (Deleted)
Date: 05/27/2012  MRN:  [id number] Name:  [name] Sex:  female Age:  79 y.o. DOB:1916/03/27   PSC #:                       Facility/Room; Level Of Care: Provider:   Emergency Contacts: Contact Information   Name Relation Home Work [place] [phone]     Rafaella, Kole [phone]        Code Status: MOST Form:  Allergies: Allergies  Allergen Reactions  . Anaprox (Naproxen Sodium)     Unknown reaction     Chief Complaint  Patient presents with  . Hypothyroidism  . Chronic Kidney Disease  . Congestive Heart Failure     HPI:  Past Medical History  Diagnosis Date  . Hypothyroidism   . Depression   . Migraines   . Dental pulp degeneration   . Corns and callus   . Gait disorder   . Urinary incontinence   . Skin cancer   . Cataract   . Skin cancer   . Insomnia     Past Surgical History  Procedure Laterality Date  . Total hip arthroplasty    . Cataract extraction    . Abdominal hysterectomy       Procedures: Consultants:  Current Outpatient Prescriptions  Medication Sig Dispense Refill  . acetaminophen (TYLENOL) 500 MG tablet Take 1,000 mg by mouth every 6 (six) hours as needed. For pain       . albuterol (PROVENTIL) (5 MG/ML) 0.5% nebulizer solution Take 2.5 mg by nebulization every 4 (four) hours as needed. wheezing      . aspirin EC 81 MG tablet Take 81 mg by mouth daily.      . bisacodyl (DULCOLAX) 5 MG EC tablet Take 5-10 mg by mouth daily as needed. For constipation      . brimonidine-timolol (COMBIGAN) 0.2-0.5 % ophthalmic solution Place 1 drop into both eyes every 12 (twelve) hours.      . Cholecalciferol 1000 UNITS tablet Take 1,000 Units by mouth 2 (two) times daily.      . clopidogrel (PLAVIX) 75 MG tablet Take 75 mg by mouth daily.        . furosemide (LASIX) 20 MG tablet Take 2 tablets (40 mg total) by mouth 2 (two) times daily.  60 tablet  0  . guaiFENesin (MUCINEX) 600 MG 12 hr tablet Take 1,200 mg by  mouth 2 (two) times daily as needed. congestion      . Ipratropium-Albuterol (DUONEB IN) Inhale 1 ampule into the lungs every 6 (six) hours as needed. wheezing      . latanoprost (XALATAN) 0.005 % ophthalmic solution Place 1 drop into both eyes at bedtime.      Marland Kitchen levofloxacin (LEVAQUIN) 250 MG tablet Take 1 tablet (250 mg total) by mouth every other day. For 4 more doses      . levothyroxine (SYNTHROID, LEVOTHROID) 100 MCG tablet Take 100 mcg by mouth daily.      . metoprolol (LOPRESSOR) 50 MG tablet Take 50 mg by mouth 2 (two) times daily.      . Multiple Vitamins-Minerals (PRESERVISION AREDS 2 PO) Take 1 capsule by mouth 2 (two) times daily.      . nitroGLYCERIN (NITROSTAT) 0.4 MG SL tablet Place 0.4 mg under the tongue once.        . pantoprazole (PROTONIX) 40 MG tablet Take 40 mg by mouth daily.      Marland Kitchen  potassium chloride SA (K-DUR,KLOR-CON) 20 MEQ tablet Take 1 tablet (20 mEq total) by mouth 2 (two) times daily.      Marland Kitchen saccharomyces boulardii (FLORASTOR) 250 MG capsule Take 250 mg by mouth 2 (two) times daily.      Marland Kitchen senna-docusate (SENOKOT-S) 8.6-50 MG per tablet Take 1 tablet by mouth 2 (two) times daily.       No current facility-administered medications for this visit.     There is no immunization history on file for this patient.   Diet:  History  Substance Use Topics  . Smoking status: Never Smoker   . Smokeless tobacco: Never Used  . Alcohol Use: No    Family History  Problem Relation Age of Onset  . Colon cancer Neg Hx      {Ros - complete:30496}  Vital signs: There were no vitals taken for this visit.  {exam, Complete:18323}  Screening Score  MMS    PHQ2    PHQ9     Fall Risk    BIMS    Annual summary: Hospitalizations:  Problem List as of 05/27/2012     ICD-9-CM   PAF (paroxysmal atrial fibrillation)   Malnutrition of moderate degree   HTN (hypertension)   Dysphagia   Protein-calorie malnutrition, moderate   CKD (chronic kidney disease), stage IV    History of compression fracture of vertebral column   Depression   Anemia   Dysphagia, unspecified   Last Assessment & Plan   12/20/2010 Office Visit Edited 12/21/2010  2:37 PM by Louis Meckel, MD     Symptoms may be due to a fixed esophageal stricture from either peptic disease or possible malignancy. She could also be suffering from esophageal dysmotility and problems with deglutation  Recommendations #1 upper endoscopy with dilatation as indicated  Risks, alternatives, and complications of the procedure, including bleeding, perforation, and possible need for surgery, were explained to the patient.  Patient's questions were answered.     NSTEMI (non-ST elevation myocardial infarction)   Acute systolic heart failure   Cardiomyopathy   Respiratory failure   Klebsiella infect   UTI (urinary tract infection)   Respiratory failure, acute-on-chronic   Systolic CHF, acute on chronic   Adult failure to thrive   HCAP (healthcare-associated pneumonia)   Hypoxemia   Fever   ARF (acute renal failure)   Systolic CHF   Hyperglycemia     Infection History:  Functional assessment: Areas of potential improvement: Rehabilitation Potential: Prognosis for survival: Plan: ***

## 2012-05-27 NOTE — Progress Notes (Signed)
Subjective:     Patient ID: [name], female   DOB: 01/24/1917, 79 y.o.   MRN: [id number]  HPI Hypernatremia: Na 146 05/26/12 CHF compensated clinically, wts # 137Ibs, 141 Ibs, 139Ibs, 136Ibs,  presently taking Furosemide Chronic renal disease: Bun/creat 57/1.81 Hypothyroidism: TSH 5.383 05/26/12, takes Levothyroxine daily.  Anemia: Hgb 11.2 05/26/12   Review of Systems  Constitutional: Positive for fatigue.  HENT: Positive for hearing loss.   Respiratory: Positive for shortness of breath.   Cardiovascular: Negative for leg swelling.  Gastrointestinal: Negative for constipation.  Genitourinary: Positive for frequency.  Musculoskeletal: Positive for back pain, arthralgias and gait problem.  Skin: Negative for rash (itching arms and legs w/o rash ).  Psychiatric/Behavioral: Positive for confusion.       Objective:   Physical Exam  Constitutional: She appears well-developed.  HENT:  Head: Normocephalic.  Nose: Nose normal.  Eyes: Conjunctivae and EOM are normal. Pupils are equal, round, and reactive to light.  Neck: Neck supple. No JVD present. No thyromegaly present.  Cardiovascular: Normal rate and normal heart sounds.   Pulmonary/Chest: Breath sounds normal.  Abdominal: Soft. Bowel sounds are normal.  Musculoskeletal: She exhibits no edema.  Lymphadenopathy:    She has no cervical adenopathy.  Neurological: She is alert. She has normal reflexes. No cranial nerve deficit.  Skin: Skin is warm and dry. Rash: dry and scaly.  Psychiatric: She has a normal mood and affect.       Assessment:   CHF compensated clinically Hypothyroidism: not well controlled with mildly elevated TSH 5.383 05/26/12 Chronic renal insufficiency: elevated Bun/creat 57/1.81 05/26/12, diuretic and chronic renal disease     Plan:   CHF continue Furosemide and discontinue daily weight   Hypothyroidism: increase Levothyroxine to daily, f/u TSH in 12 weeks.  Chronic renal insufficiency:  creat near 2s in the past year--observe.

## 2012-05-27 NOTE — Progress Notes (Deleted)
Patient ID: [name], female   DOB: Jul 21, 1916, 79 y.o.   MRN: [id number]

## 2012-06-03 ENCOUNTER — Non-Acute Institutional Stay (SKILLED_NURSING_FACILITY): Payer: Medicare Other | Admitting: Nurse Practitioner

## 2012-06-03 ENCOUNTER — Encounter: Payer: Self-pay | Admitting: Nurse Practitioner

## 2012-06-03 DIAGNOSIS — I10 Essential (primary) hypertension: Secondary | ICD-10-CM

## 2012-06-03 DIAGNOSIS — N184 Chronic kidney disease, stage 4 (severe): Secondary | ICD-10-CM

## 2012-06-03 DIAGNOSIS — L02619 Cutaneous abscess of unspecified foot: Secondary | ICD-10-CM | POA: Insufficient documentation

## 2012-06-03 DIAGNOSIS — L0390 Cellulitis, unspecified: Secondary | ICD-10-CM

## 2012-06-03 DIAGNOSIS — L0291 Cutaneous abscess, unspecified: Secondary | ICD-10-CM

## 2012-06-03 DIAGNOSIS — L03119 Cellulitis of unspecified part of limb: Secondary | ICD-10-CM | POA: Insufficient documentation

## 2012-06-03 DIAGNOSIS — I5020 Unspecified systolic (congestive) heart failure: Secondary | ICD-10-CM

## 2012-06-03 DIAGNOSIS — R0902 Hypoxemia: Secondary | ICD-10-CM

## 2012-06-03 MED ORDER — DOXYCYCLINE HYCLATE 50 MG PO CAPS: 100.0000 mg | ORAL_CAPSULE | Freq: Two times a day (BID) | ORAL | Status: AC

## 2012-06-03 MED ORDER — BACITRACIN ZINC 500 UNIT/GM EX OINT: TOPICAL_OINTMENT | Freq: Two times a day (BID) | CUTANEOUS | Status: DC

## 2012-06-03 NOTE — Progress Notes (Signed)
Subjective:     Patient ID: [name], female   DOB: 1916-04-27, 79 y.o.   MRN: [id number]  HPI  C/o the right great toe pain, found small open area at the tip of the toe, entire the right great toe fever, swelling, tender, redness for 2-3 days.   Hypernatremia: Na 146 05/26/12-will f/u   CHF compensated clinically, wts # 137Ibs, 141 Ibs, 139Ibs, 136Ibs,  presently taking Furosemide  Chronic renal disease: Bun/creat 57/1.81  Hypothyroidism: TSH 5.383 05/26/12, takes Levothyroxine daily since 05/27/12-f/u TSH in 12 weeks scheduled.   Anemia: Hgb 11.2 05/26/12   Review of Systems  Constitutional: Positive for fatigue.  HENT: Positive for hearing loss.   Eyes: Negative.   Respiratory: Positive for shortness of breath.   Cardiovascular: Negative for leg swelling.  Gastrointestinal: Negative for constipation.  Endocrine: Negative.   Genitourinary: Positive for frequency.  Musculoskeletal: Positive for back pain, arthralgias and gait problem.  Skin: Positive for wound (the right great toe small open wound at the tip of the toe, entire the rigth great toe warmth, erythema,  swelling, tenderness). Negative for rash (itching arms and legs w/o rash ).  Allergic/Immunologic: Negative.   Neurological: Negative.   Hematological: Negative.   Psychiatric/Behavioral: Positive for confusion.       Objective:   Physical Exam  Constitutional: She appears well-developed.  HENT:  Head: Normocephalic.  Nose: Nose normal.  Eyes: Conjunctivae and EOM are normal. Pupils are equal, round, and reactive to light.  Neck: Neck supple. No JVD present. No thyromegaly present.  Cardiovascular: Normal rate and normal heart sounds.   Pulmonary/Chest: Breath sounds normal.  Abdominal: Soft. Bowel sounds are normal.  Musculoskeletal: She exhibits no edema.  Lymphadenopathy:    She has no cervical adenopathy.  Neurological: She is alert. She has normal reflexes. No cranial nerve deficit.  Skin: Skin is  warm and dry. Abrasion noted. Rash: dry and scaly. There is erythema.     Psychiatric: She has a normal mood and affect.       Assessment:   CHF compensated clinically Hypothyroidism: not well controlled with mildly elevated TSH 5.383 05/26/12 Chronic renal insufficiency: elevated Bun/creat 57/1.81 05/26/12, diuretic and chronic renal disease Cellulitis:  the right great--new    Plan:   CHF continue Furosemide and discontinue daily weight since wts stable.    Hypothyroidism: increase Levothyroxine to daily, f/u TSH in 12 weeks scheduled Chronic renal insufficiency: creat near 2s in the past year--observe.  Cellulitis the right great toe--bacitracin oint daily to the right toe open area, Doxycycline 100mg  bid and FloraStor bid for total 10 days Hyponatremia: Na 146 last checked, f/u BMP in one week.

## 2012-06-17 ENCOUNTER — Non-Acute Institutional Stay (SKILLED_NURSING_FACILITY): Payer: Medicare Other | Admitting: Nurse Practitioner

## 2012-06-17 DIAGNOSIS — I509 Heart failure, unspecified: Secondary | ICD-10-CM

## 2012-06-17 DIAGNOSIS — E870 Hyperosmolality and hypernatremia: Secondary | ICD-10-CM

## 2012-06-17 DIAGNOSIS — N289 Disorder of kidney and ureter, unspecified: Secondary | ICD-10-CM

## 2012-06-17 NOTE — Progress Notes (Signed)
Patient ID: [name], female   DOB: 1917-02-24, 79 y.o.   MRN: [id number] Subjective:     Patient ID: [name], female   DOB: June 13, 1916, 79 y.o.   MRN: [id number]  HPI C/o the right great toe pain, found small open area at the tip of the toe, entire the right great toe fever, swelling, tender, redness--resolved after 10 day course of Doxy and topic Bacitracin ointment, no edema today and c/o TED hurts her toes  Hypernatremia: Na 146 05/26/12-then 148 06/09/12--related Lasix for CHF-  CHF compensated clinically, wts # 137Ibs, 141 Ibs, 139Ibs, 136Ibs,  presently taking Furosemide  Chronic renal disease: Bun/creat 57/1.81-70/1.84 06/09/12  Hypothyroidism: TSH 5.383 05/26/12, takes Levothyroxine daily since 05/27/12-f/u TSH in 12 weeks scheduled.   Anemia: Hgb 11.2 05/26/12   Review of Systems  Constitutional: Positive for fatigue.  HENT: Positive for hearing loss.   Eyes: Negative.   Respiratory: Positive for shortness of breath.   Cardiovascular: Negative for leg swelling.  Gastrointestinal: Negative for constipation.  Endocrine: Negative.   Genitourinary: Positive for frequency.  Musculoskeletal: Positive for back pain, arthralgias and gait problem.  Skin: Positive for wound (the right great toe small open wound at the tip of the toe, entire the rigth great toe warmth, erythema,  swelling, tenderness resolved). Negative for rash (itching arms and legs w/o rash ).  Allergic/Immunologic: Negative.   Neurological: Negative.   Hematological: Negative.   Psychiatric/Behavioral: Positive for confusion.        Objective:   Physical Exam  Constitutional: She appears well-developed.  HENT:  Head: Normocephalic.  Nose: Nose normal.  Eyes: Conjunctivae and EOM are normal. Pupils are equal, round, and reactive to light.  Neck: Neck supple. No JVD present. No thyromegaly present.  Cardiovascular: Normal rate and normal heart sounds.   Pulmonary/Chest: Breath sounds normal.    Abdominal: Soft. Bowel sounds are normal.  Musculoskeletal: She exhibits no edema.  Lymphadenopathy:    She has no cervical adenopathy.  Neurological: She is alert. She has normal reflexes. No cranial nerve deficit.  Skin: Skin is warm and dry. No abrasion noted. Rash: dry and scaly. No erythema.     Psychiatric: She has a normal mood and affect.       Assessment:   CHF compensated clinically Hypothyroidism: not well controlled with mildly elevated TSH 5.383 05/26/12 Chronic renal insufficiency: elevated Bun/creat 57/1.81 05/26/12, diuretic and chronic renal disease Cellulitis:  the right great--new    Plan:   CHF continue Furosemide and discontinue daily weight since wts stable.    Hypothyroidism: increased Levothyroxine to daily, f/u TSH in 12 weeks scheduled Chronic renal insufficiency: creat near 2s in the past year--observe.  Cellulitis the right great toe--bacitracin oint daily to the right toe open area, Doxycycline 100mg  bid and [employer] bid for total 10 days--completed tx and resolved, de TED Hyponatremia: Na 148 last checked, f/u BMP next lab.

## 2012-06-19 LAB — BASIC METABOLIC PANEL: Glucose: 98 mg/dL

## 2012-07-22 ENCOUNTER — Non-Acute Institutional Stay (SKILLED_NURSING_FACILITY): Payer: Medicare Other | Admitting: Nurse Practitioner

## 2012-07-22 DIAGNOSIS — I10 Essential (primary) hypertension: Secondary | ICD-10-CM

## 2012-07-22 DIAGNOSIS — I5020 Unspecified systolic (congestive) heart failure: Secondary | ICD-10-CM

## 2012-07-22 DIAGNOSIS — N179 Acute kidney failure, unspecified: Secondary | ICD-10-CM

## 2012-07-22 DIAGNOSIS — K219 Gastro-esophageal reflux disease without esophagitis: Secondary | ICD-10-CM

## 2012-07-22 DIAGNOSIS — E039 Hypothyroidism, unspecified: Secondary | ICD-10-CM

## 2012-07-22 NOTE — Assessment & Plan Note (Signed)
Persists, creat near 2, the patient desires no fluid restriction, will monitor weight.

## 2012-07-22 NOTE — Assessment & Plan Note (Signed)
Stable on Protonix 40 mg daily. 

## 2012-07-22 NOTE — Assessment & Plan Note (Signed)
Compensated with Furosemide 40mg  bid.

## 2012-07-22 NOTE — Progress Notes (Signed)
Patient ID: [name], female   DOB: [birth year], 79 y.o.   MRN: [id number]  Chief Complaint:  Chief Complaint  Patient presents with  . Medical Managment of Chronic Issues     HPI:   Problem List Items Addressed This Visit     ICD-9-CM   ARF (acute renal failure)     Persists, creat near 2, the patient desires no fluid restriction, will monitor weight.     Systolic CHF     Compensated with Furosemide 40mg  bid.     GERD (gastroesophageal reflux disease)     Stable on Protonix 40mg  daily    HTN (hypertension) (Chronic)     Controlled on Metoprolol 50mg  bid.     Unspecified hypothyroidism - Primary (Chronic)     Takes Levothyroxine since 05/27/12, f/u TSH in 12 wks scheduled. Last TSH 5.383 05/26/12       Review of Systems:  Review of Systems  Constitutional: Negative for fever, chills, weight loss, malaise/fatigue and diaphoresis.  HENT: Positive for hearing loss. Negative for congestion, sore throat and neck pain.   Eyes: Negative for pain, discharge and redness.  Respiratory: Positive for cough. Negative for sputum production, shortness of breath and wheezing.   Cardiovascular: Positive for leg swelling and PND. Negative for chest pain, palpitations, orthopnea and claudication.  Gastrointestinal: Negative for heartburn, nausea, vomiting, abdominal pain, diarrhea, constipation, blood in stool and melena.  Genitourinary: Positive for frequency (incontinent of bladder). Negative for dysuria, urgency, hematuria and flank pain.  Musculoskeletal: Positive for back pain. Negative for myalgias, joint pain and falls.  Skin: Negative for itching and rash.  Neurological: Negative for dizziness, tingling, tremors, sensory change, speech change, focal weakness, seizures, loss of consciousness, weakness and headaches.  Endo/Heme/Allergies: Negative for environmental allergies and polydipsia. Does not bruise/bleed easily.  Psychiatric/Behavioral: Positive for memory loss. Negative  for depression and hallucinations. The patient is not nervous/anxious and does not have insomnia.      Medications: Patient's Medications  New Prescriptions   No medications on file  Previous Medications   ACETAMINOPHEN (TYLENOL) 500 MG TABLET    Take 1,000 mg by mouth every 6 (six) hours as needed. For pain    ALBUTEROL (PROVENTIL) (5 MG/ML) 0.5% NEBULIZER SOLUTION    Take 2.5 mg by nebulization every 4 (four) hours as needed. wheezing   ASPIRIN EC 81 MG TABLET    Take 81 mg by mouth daily.   BACITRACIN (RA BACITRACIN) OINTMENT    Apply topically 2 (two) times daily.   BISACODYL (DULCOLAX) 5 MG EC TABLET    Take 5-10 mg by mouth daily as needed. For constipation   BRIMONIDINE-TIMOLOL (COMBIGAN) 0.2-0.5 % OPHTHALMIC SOLUTION    Place 1 drop into both eyes every 12 (twelve) hours.   CHOLECALCIFEROL 1000 UNITS TABLET    Take 1,000 Units by mouth 2 (two) times daily.   CLOPIDOGREL (PLAVIX) 75 MG TABLET    Take 75 mg by mouth daily.     FUROSEMIDE (LASIX) 20 MG TABLET    Take 2 tablets (40 mg total) by mouth 2 (two) times daily.   GUAIFENESIN (MUCINEX) 600 MG 12 HR TABLET    Take 1,200 mg by mouth 2 (two) times daily as needed. congestion   HYDROXYZINE (ATARAX/VISTARIL) 25 MG TABLET    Take 12.5 mg by mouth every evening.   IPRATROPIUM-ALBUTEROL (DUONEB IN)    Inhale 1 ampule into the lungs every 6 (six) hours as needed. wheezing   LATANOPROST (XALATAN) 0.005 %  OPHTHALMIC SOLUTION    Place 1 drop into both eyes at bedtime.   LEVOFLOXACIN (LEVAQUIN) 250 MG TABLET    Take 1 tablet (250 mg total) by mouth every other day. For 4 more doses   LEVOTHYROXINE (SYNTHROID, LEVOTHROID) 125 MCG TABLET    Take 125 mcg by mouth daily.   MULTIPLE VITAMINS-MINERALS (PRESERVISION AREDS 2 PO)    Take 1 capsule by mouth 2 (two) times daily.   NITROGLYCERIN (NITROSTAT) 0.4 MG SL TABLET    Place 0.4 mg under the tongue once.     PANTOPRAZOLE (PROTONIX) 40 MG TABLET    Take 40 mg by mouth daily.   POTASSIUM  CHLORIDE SA (K-DUR,KLOR-CON) 20 MEQ TABLET    Take 1 tablet (20 mEq total) by mouth 2 (two) times daily.   SACCHAROMYCES BOULARDII (FLORASTOR) 250 MG CAPSULE    Take 250 mg by mouth 2 (two) times daily.   SENNA-DOCUSATE (SENOKOT-S) 8.6-50 MG PER TABLET    Take 1 tablet by mouth 2 (two) times daily.  Modified Medications   No medications on file  Discontinued Medications   No medications on file     Physical Exam: Physical Exam  Constitutional: She appears well-developed.  HENT:  Head: Normocephalic.  Nose: Nose normal.  Eyes: Conjunctivae and EOM are normal. Pupils are equal, round, and reactive to light.  Neck: Neck supple. No JVD present. No thyromegaly present.  Cardiovascular: Normal rate and normal heart sounds.   Pulmonary/Chest: Breath sounds normal.  Abdominal: Soft. Bowel sounds are normal.  Musculoskeletal: She exhibits no edema.  Lymphadenopathy:    She has no cervical adenopathy.  Neurological: She is alert. She has normal reflexes. No cranial nerve deficit.  Skin: Skin is warm and dry. No abrasion noted. Rash: dry and scaly. No erythema.     Psychiatric: She has a normal mood and affect.     Filed Vitals:   07/22/12 1517  BP: 128/60  Pulse: 76  Temp: 98.5 F (36.9 C)  TempSrc: Tympanic  Resp: 20      Labs reviewed: Basic Metabolic Panel:  Recent Labs  21/30/86 0545 03/26/12 0600 03/27/12 0615 05/26/12 06/19/12  NA 145 145 144  --  145  K 4.3 3.4* 4.2  --  4.2  CL 103 101 102  --   --   CO2 27 33* 30  --   --   GLUCOSE 131* 109* 101*  --   --   BUN 71* 73* 79*  --  65*  CREATININE 1.87* 1.91* 1.97*  --  1.9*  CALCIUM 8.8 9.2 9.5  --   --   MG 2.2 2.2 2.2  --   --   TSH  --   --   --  5.38  --     Liver Function Tests: No results found for this basename: AST, ALT, ALKPHOS, BILITOT, PROT, ALBUMIN,  in the last 8760 hours  CBC:  Recent Labs  03/23/12 0057  03/23/12 0558 03/24/12 0944 03/26/12 0600 05/26/12  WBC 11.7*  --  11.1* 8.2  9.1 6.3  NEUTROABS 9.5*  --   --   --   --   --   HGB 10.5*  < > 10.7* 10.0* 11.5* 11.2*  HCT 33.7*  < > 34.0* 33.1* 36.6 34*  MCV 97.4  --  98.0 98.5 96.1  --   PLT 241  --  238 239 280 247  < > = values in this interval not displayed.  Anemia Panel: No  results found for this basename: IRON, FOLATE, VITAMINB12,  in the last 8760 hours  Significant Diagnostic Results:     Assessment/Plan Unspecified hypothyroidism Takes Levothyroxine since 05/27/12, f/u TSH in 12 wks scheduled. Last TSH 5.383 05/26/12  GERD (gastroesophageal reflux disease) Stable on Protonix 40mg  daily  ARF (acute renal failure) Persists, creat near 2, the patient desires no fluid restriction, will monitor weight.   Systolic CHF Compensated with Furosemide 40mg  bid.   HTN (hypertension) Controlled on Metoprolol 50mg  bid.       Family/ staff Communication: lift fluid restriction, monitor weight x2/week.    Goals of care: SNF   Labs/tests ordered none

## 2012-07-22 NOTE — Assessment & Plan Note (Signed)
Controlled on Metoprolol 50mg bid.                

## 2012-07-22 NOTE — Assessment & Plan Note (Signed)
Takes Levothyroxine 125mcg since 05/27/12, f/u TSH in 12 wks scheduled. Last TSH 5.383 05/26/12   

## 2012-08-12 ENCOUNTER — Non-Acute Institutional Stay (SKILLED_NURSING_FACILITY): Payer: Medicare Other | Admitting: Nurse Practitioner

## 2012-08-12 DIAGNOSIS — D649 Anemia, unspecified: Secondary | ICD-10-CM

## 2012-08-12 DIAGNOSIS — E039 Hypothyroidism, unspecified: Secondary | ICD-10-CM

## 2012-08-12 DIAGNOSIS — I480 Paroxysmal atrial fibrillation: Secondary | ICD-10-CM

## 2012-08-12 DIAGNOSIS — I4891 Unspecified atrial fibrillation: Secondary | ICD-10-CM

## 2012-08-12 DIAGNOSIS — I10 Essential (primary) hypertension: Secondary | ICD-10-CM

## 2012-08-12 NOTE — Assessment & Plan Note (Signed)
Takes Levothyroxine since 05/27/12, f/u TSH in 12 wks scheduled. Last TSH 5.383 05/26/12

## 2012-08-12 NOTE — Assessment & Plan Note (Signed)
Takes  Furosemide 40mg  bid. Weights: 138.7 07/29/12, 139.9 08/04/12, 142.6 08/08/12, 144.1 08/12/12--will have Furosemide 40mg  daily prn for wt gain 2-3Ibs/24hrs or 3-5Ibs/week, update CMP and BNP.

## 2012-08-12 NOTE — Assessment & Plan Note (Signed)
Update CBC, last Hgb 11.2 05/26/12

## 2012-08-12 NOTE — Assessment & Plan Note (Signed)
Controlled on Metoprolol 50mg bid.                

## 2012-08-12 NOTE — Progress Notes (Signed)
Patient ID: [name], female   DOB: [birth year], 79 y.o.   MRN: [id number]  Chief Complaint:  Chief Complaint  Patient presents with  . Medical Managment of Chronic Issues    weight gain.      HPI:   Problem List Items Addressed This Visit   PAF (paroxysmal atrial fibrillation) - Primary (Chronic)     Rate controlled Metoprolol 50mg  bid.     HTN (hypertension) (Chronic)     Controlled on Metoprolol 50mg  bid.       Anemia (Chronic)     Update CBC, last Hgb 11.2 05/26/12    Unspecified hypothyroidism (Chronic)     Takes Levothyroxine since 05/27/12, f/u TSH in 12 wks scheduled. Last TSH 5.383 05/26/12         Review of Systems:  Review of Systems  Constitutional: Negative for fever, chills, weight loss, malaise/fatigue and diaphoresis.  HENT: Positive for hearing loss. Negative for congestion, sore throat and neck pain.   Eyes: Negative for pain, discharge and redness.  Respiratory: Positive for cough. Negative for sputum production, shortness of breath and wheezing.   Cardiovascular: Positive for leg swelling and PND. Negative for chest pain, palpitations, orthopnea and claudication.  Gastrointestinal: Negative for heartburn, nausea, vomiting, abdominal pain, diarrhea, constipation, blood in stool and melena.  Genitourinary: Positive for frequency (incontinent of bladder). Negative for dysuria, urgency, hematuria and flank pain.  Musculoskeletal: Positive for back pain. Negative for myalgias, joint pain and falls.  Skin: Negative for itching and rash.  Neurological: Negative for dizziness, tingling, tremors, sensory change, speech change, focal weakness, seizures, loss of consciousness, weakness and headaches.  Endo/Heme/Allergies: Negative for environmental allergies and polydipsia. Does not bruise/bleed easily.  Psychiatric/Behavioral: Positive for memory loss. Negative for depression and hallucinations. The patient is not nervous/anxious and does not have  insomnia.      Medications: Patient's Medications  New Prescriptions   No medications on file  Previous Medications   ACETAMINOPHEN (TYLENOL) 500 MG TABLET    Take 1,000 mg by mouth every 6 (six) hours as needed. For pain    ALBUTEROL (PROVENTIL) (5 MG/ML) 0.5% NEBULIZER SOLUTION    Take 2.5 mg by nebulization every 4 (four) hours as needed. wheezing   ASPIRIN EC 81 MG TABLET    Take 81 mg by mouth daily.   BACITRACIN (RA BACITRACIN) OINTMENT    Apply topically 2 (two) times daily.   BISACODYL (DULCOLAX) 5 MG EC TABLET    Take 5-10 mg by mouth daily as needed. For constipation   BRIMONIDINE-TIMOLOL (COMBIGAN) 0.2-0.5 % OPHTHALMIC SOLUTION    Place 1 drop into both eyes every 12 (twelve) hours.   CHOLECALCIFEROL 1000 UNITS TABLET    Take 1,000 Units by mouth 2 (two) times daily.   CLOPIDOGREL (PLAVIX) 75 MG TABLET    Take 75 mg by mouth daily.     FUROSEMIDE (LASIX) 20 MG TABLET    Take 2 tablets (40 mg total) by mouth 2 (two) times daily.   GUAIFENESIN (MUCINEX) 600 MG 12 HR TABLET    Take 1,200 mg by mouth 2 (two) times daily as needed. congestion   HYDROXYZINE (ATARAX/VISTARIL) 25 MG TABLET    Take 12.5 mg by mouth every evening.   IPRATROPIUM-ALBUTEROL (DUONEB IN)    Inhale 1 ampule into the lungs every 6 (six) hours as needed. wheezing   LATANOPROST (XALATAN) 0.005 % OPHTHALMIC SOLUTION    Place 1 drop into both eyes at bedtime.   LEVOFLOXACIN (LEVAQUIN) 250  MG TABLET    Take 1 tablet (250 mg total) by mouth every other day. For 4 more doses   LEVOTHYROXINE (SYNTHROID, LEVOTHROID) 125 MCG TABLET    Take 125 mcg by mouth daily.   MULTIPLE VITAMINS-MINERALS (PRESERVISION AREDS 2 PO)    Take 1 capsule by mouth 2 (two) times daily.   NITROGLYCERIN (NITROSTAT) 0.4 MG SL TABLET    Place 0.4 mg under the tongue once.     PANTOPRAZOLE (PROTONIX) 40 MG TABLET    Take 40 mg by mouth daily.   POTASSIUM CHLORIDE SA (K-DUR,KLOR-CON) 20 MEQ TABLET    Take 1 tablet (20 mEq total) by mouth 2 (two)  times daily.   SACCHAROMYCES BOULARDII (FLORASTOR) 250 MG CAPSULE    Take 250 mg by mouth 2 (two) times daily.   SENNA-DOCUSATE (SENOKOT-S) 8.6-50 MG PER TABLET    Take 1 tablet by mouth 2 (two) times daily.  Modified Medications   No medications on file  Discontinued Medications   No medications on file     Physical Exam: Physical Exam  Constitutional: She appears well-developed.  HENT:  Head: Normocephalic.  Nose: Nose normal.  Eyes: Conjunctivae and EOM are normal. Pupils are equal, round, and reactive to light.  Neck: Neck supple. No JVD present. No thyromegaly present.  Cardiovascular: Normal rate and normal heart sounds.   Pulmonary/Chest: Breath sounds normal.  Abdominal: Soft. Bowel sounds are normal.  Musculoskeletal: She exhibits no edema.  Lymphadenopathy:    She has no cervical adenopathy.  Neurological: She is alert. She has normal reflexes. No cranial nerve deficit.  Skin: Skin is warm and dry. No abrasion noted. Rash: dry and scaly. No erythema.     Psychiatric: She has a normal mood and affect.     Filed Vitals:   08/12/12 1331  BP: 134/68  Pulse: 84  Temp: 100 F (37.8 C)  TempSrc: Tympanic  Resp: 16      Labs reviewed: Basic Metabolic Panel:  Recent Labs  16/10/96 0545 03/26/12 0600 03/27/12 0615 05/26/12 06/19/12  NA 145 145 144  --  145  K 4.3 3.4* 4.2  --  4.2  CL 103 101 102  --   --   CO2 27 33* 30  --   --   GLUCOSE 131* 109* 101*  --   --   BUN 71* 73* 79*  --  65*  CREATININE 1.87* 1.91* 1.97*  --  1.9*  CALCIUM 8.8 9.2 9.5  --   --   MG 2.2 2.2 2.2  --   --   TSH  --   --   --  5.38  --     Liver Function Tests: No results found for this basename: AST, ALT, ALKPHOS, BILITOT, PROT, ALBUMIN,  in the last 8760 hours  CBC:  Recent Labs  03/23/12 0057  03/23/12 0558 03/24/12 0944 03/26/12 0600 05/26/12  WBC 11.7*  --  11.1* 8.2 9.1 6.3  NEUTROABS 9.5*  --   --   --   --   --   HGB 10.5*  < > 10.7* 10.0* 11.5* 11.2*    HCT 33.7*  < > 34.0* 33.1* 36.6 34*  MCV 97.4  --  98.0 98.5 96.1  --   PLT 241  --  238 239 280 247  < > = values in this interval not displayed.  Anemia Panel: No results found for this basename: IRON, FOLATE, VITAMINB12,  in the last 8760 hours  Significant Diagnostic  Results:     Assessment/Plan Systolic CHF Takes  Furosemide 40mg  bid. Weights: 138.7 07/29/12, 139.9 08/04/12, 142.6 08/08/12, 144.1 08/12/12--will have Furosemide 40mg  daily prn for wt gain 2-3Ibs/24hrs or 3-5Ibs/week, update CMP and BNP.     PAF (paroxysmal atrial fibrillation) Rate controlled Metoprolol 50mg  bid.   HTN (hypertension) Controlled on Metoprolol 50mg  bid.     Unspecified hypothyroidism Takes Levothyroxine since 05/27/12, f/u TSH in 12 wks scheduled. Last TSH 5.383 05/26/12    Anemia Update CBC, last Hgb 11.2 05/26/12      Family/ staff Communication: observe the patient and monitor weights   Goals of care: SNF   Labs/tests ordered CBC, CMP, TSH, BNP

## 2012-08-12 NOTE — Assessment & Plan Note (Signed)
Rate controlled Metoprolol 50mg  bid.

## 2012-08-14 LAB — CBC AND DIFFERENTIAL
HCT: 34 % — AB (ref 36–46)
Hemoglobin: 10.8 g/dL — AB (ref 12.0–16.0)
Platelets: 209 10*3/uL (ref 150–399)

## 2012-08-14 LAB — HEPATIC FUNCTION PANEL
AST: 19 U/L (ref 13–35)
Bilirubin, Total: 0.4 mg/dL

## 2012-08-14 LAB — TSH: TSH: 6.83 u[IU]/mL — AB (ref 0.41–5.90)

## 2012-08-14 LAB — BASIC METABOLIC PANEL
BUN: 49 mg/dL — AB (ref 4–21)
Glucose: 96 mg/dL

## 2012-08-15 ENCOUNTER — Non-Acute Institutional Stay (SKILLED_NURSING_FACILITY): Payer: Medicare Other | Admitting: Nurse Practitioner

## 2012-08-15 DIAGNOSIS — N184 Chronic kidney disease, stage 4 (severe): Secondary | ICD-10-CM

## 2012-08-15 DIAGNOSIS — D649 Anemia, unspecified: Secondary | ICD-10-CM

## 2012-08-15 DIAGNOSIS — E039 Hypothyroidism, unspecified: Secondary | ICD-10-CM

## 2012-08-15 NOTE — Assessment & Plan Note (Signed)
Hgb 10.8 08/14/12, last Hgb 11.2 05/26/12

## 2012-08-15 NOTE — Assessment & Plan Note (Signed)
Persists, creat near 2(1.67 08/14/12), the patient desires no fluid restriction, will monitor weight.

## 2012-08-15 NOTE — Progress Notes (Signed)
Patient ID: [name], female   DOB: [birth year], 79 y.o.   MRN: [id number]  Chief Complaint:  Chief Complaint  Patient presents with  . Medical Managment of Chronic Issues    hypothyroidism     HPI:   Problem List Items Addressed This Visit   CKD (chronic kidney disease), stage IV (Chronic)     Persists, creat near 2(1.67 08/14/12), the patient desires no fluid restriction, will monitor weight.       Anemia (Chronic)     Hgb 10.8 08/14/12, last Hgb 11.2 05/26/12      Unspecified hypothyroidism - Primary (Chronic)     Takes Levothyroxine since 05/27/12, f/u TSH in 12 wks scheduled 6.833 08/14/12. Last TSH 5.383 05/26/12--increase Levothyroxine to po daily, f/u TSH in 12 weeks.          Review of Systems:  Review of Systems  Constitutional: Negative for fever, chills, weight loss, malaise/fatigue and diaphoresis.  HENT: Positive for hearing loss. Negative for congestion, sore throat and neck pain.   Eyes: Negative for pain, discharge and redness.  Respiratory: Positive for cough. Negative for sputum production, shortness of breath and wheezing.   Cardiovascular: Positive for leg swelling and PND. Negative for chest pain, palpitations, orthopnea and claudication.  Gastrointestinal: Negative for heartburn, nausea, vomiting, abdominal pain, diarrhea, constipation, blood in stool and melena.  Genitourinary: Positive for frequency (incontinent of bladder). Negative for dysuria, urgency, hematuria and flank pain.  Musculoskeletal: Positive for back pain. Negative for myalgias, joint pain and falls.  Skin: Negative for itching and rash.  Neurological: Negative for dizziness, tingling, tremors, sensory change, speech change, focal weakness, seizures, loss of consciousness, weakness and headaches.  Endo/Heme/Allergies: Negative for environmental allergies and polydipsia. Does not bruise/bleed easily.  Psychiatric/Behavioral: Positive for memory loss. Negative for depression  and hallucinations. The patient is not nervous/anxious and does not have insomnia.      Medications: Patient's Medications  New Prescriptions   No medications on file  Previous Medications   ACETAMINOPHEN (TYLENOL) 500 MG TABLET    Take 1,000 mg by mouth every 6 (six) hours as needed. For pain    ALBUTEROL (PROVENTIL) (5 MG/ML) 0.5% NEBULIZER SOLUTION    Take 2.5 mg by nebulization every 4 (four) hours as needed. wheezing   ASPIRIN EC 81 MG TABLET    Take 81 mg by mouth daily.   BACITRACIN (RA BACITRACIN) OINTMENT    Apply topically 2 (two) times daily.   BISACODYL (DULCOLAX) 5 MG EC TABLET    Take 5-10 mg by mouth daily as needed. For constipation   BRIMONIDINE-TIMOLOL (COMBIGAN) 0.2-0.5 % OPHTHALMIC SOLUTION    Place 1 drop into both eyes every 12 (twelve) hours.   CHOLECALCIFEROL 1000 UNITS TABLET    Take 1,000 Units by mouth 2 (two) times daily.   CLOPIDOGREL (PLAVIX) 75 MG TABLET    Take 75 mg by mouth daily.     FUROSEMIDE (LASIX) 20 MG TABLET    Take 2 tablets (40 mg total) by mouth 2 (two) times daily.   GUAIFENESIN (MUCINEX) 600 MG 12 HR TABLET    Take 1,200 mg by mouth 2 (two) times daily as needed. congestion   HYDROXYZINE (ATARAX/VISTARIL) 25 MG TABLET    Take 12.5 mg by mouth every evening.   IPRATROPIUM-ALBUTEROL (DUONEB IN)    Inhale 1 ampule into the lungs every 6 (six) hours as needed. wheezing   LATANOPROST (XALATAN) 0.005 % OPHTHALMIC SOLUTION    Place 1 drop into  both eyes at bedtime.   LEVOFLOXACIN (LEVAQUIN) 250 MG TABLET    Take 1 tablet (250 mg total) by mouth every other day. For 4 more doses   LEVOTHYROXINE (SYNTHROID, LEVOTHROID) 125 MCG TABLET    Take 125 mcg by mouth daily.   MULTIPLE VITAMINS-MINERALS (PRESERVISION AREDS 2 PO)    Take 1 capsule by mouth 2 (two) times daily.   NITROGLYCERIN (NITROSTAT) 0.4 MG SL TABLET    Place 0.4 mg under the tongue once.     PANTOPRAZOLE (PROTONIX) 40 MG TABLET    Take 40 mg by mouth daily.   POTASSIUM CHLORIDE SA  (K-DUR,KLOR-CON) 20 MEQ TABLET    Take 1 tablet (20 mEq total) by mouth 2 (two) times daily.   SACCHAROMYCES BOULARDII (FLORASTOR) 250 MG CAPSULE    Take 250 mg by mouth 2 (two) times daily.   SENNA-DOCUSATE (SENOKOT-S) 8.6-50 MG PER TABLET    Take 1 tablet by mouth 2 (two) times daily.  Modified Medications   No medications on file  Discontinued Medications   No medications on file     Physical Exam: Physical Exam  Constitutional: She appears well-developed.  HENT:  Head: Normocephalic.  Nose: Nose normal.  Eyes: Conjunctivae and EOM are normal. Pupils are equal, round, and reactive to light.  Neck: Neck supple. No JVD present. No thyromegaly present.  Cardiovascular: Normal rate and normal heart sounds.   Pulmonary/Chest: Breath sounds normal.  Abdominal: Soft. Bowel sounds are normal.  Musculoskeletal: She exhibits no edema.  Lymphadenopathy:    She has no cervical adenopathy.  Neurological: She is alert. She has normal reflexes. No cranial nerve deficit.  Skin: Skin is warm and dry. No abrasion noted. Rash: dry and scaly. No erythema.     Psychiatric: She has a normal mood and affect.     Filed Vitals:   08/15/12 1250  BP: 144/60  Pulse: 70  Temp: 98.7 F (37.1 C)  TempSrc: Tympanic  Resp: 18      Labs reviewed: Basic Metabolic Panel:  Recent Labs  16/10/96 0545 03/26/12 0600 03/27/12 0615 05/26/12 06/19/12 08/14/12  NA 145 145 144  --  145 142  K 4.3 3.4* 4.2  --  4.2 4.3  CL 103 101 102  --   --   --   CO2 27 33* 30  --   --   --   GLUCOSE 131* 109* 101*  --   --   --   BUN 71* 73* 79*  --  65* 49*  CREATININE 1.87* 1.91* 1.97*  --  1.9* 1.7*  CALCIUM 8.8 9.2 9.5  --   --   --   MG 2.2 2.2 2.2  --   --   --   TSH  --   --   --  5.38  --  6.83*    Liver Function Tests:  Recent Labs  08/14/12  AST 19  ALT 13  ALKPHOS 77    CBC:  Recent Labs  03/23/12 0057  03/23/12 0558 03/24/12 0944 03/26/12 0600 05/26/12 08/14/12  WBC 11.7*  --   11.1* 8.2 9.1 6.3 7.7  NEUTROABS 9.5*  --   --   --   --   --   --   HGB 10.5*  < > 10.7* 10.0* 11.5* 11.2* 10.8*  HCT 33.7*  < > 34.0* 33.1* 36.6 34* 34*  MCV 97.4  --  98.0 98.5 96.1  --   --   PLT 241  --  238 239 280 247 209  < > = values in this interval not displayed.  Anemia Panel: No results found for this basename: IRON, FOLATE, VITAMINB12,  in the last 8760 hours  Significant Diagnostic Results:     Assessment/Plan Systolic CHF Compensated with Furosemide 40mg  bid with prn Furosemide 40mg  for wt gain 2-3Ibs/24hr or 3-5Ib/week. BNP 638.9 08/14/12    Unspecified hypothyroidism Takes Levothyroxine since 05/27/12, f/u TSH in 12 wks scheduled 6.833 08/14/12. Last TSH 5.383 05/26/12--increase Levothyroxine to po daily, f/u TSH in 12 weeks.     Anemia Hgb 10.8 08/14/12, last Hgb 11.2 05/26/12    CKD (chronic kidney disease), stage IV Persists, creat near 2(1.67 08/14/12), the patient desires no fluid restriction, will monitor weight.        Family/ staff Communication: observe the patient   Goals of care: SNF   Labs/tests ordered TSH in 12 weeks.

## 2012-08-15 NOTE — Assessment & Plan Note (Signed)
Compensated with Furosemide 40mg  bid with prn Furosemide 40mg  for wt gain 2-3Ibs/24hr or 3-5Ib/week. BNP 638.9 08/14/12

## 2012-08-15 NOTE — Assessment & Plan Note (Signed)
Takes Levothyroxine since 05/27/12, f/u TSH in 12 wks scheduled 6.833 08/14/12. Last TSH 5.383 05/26/12--increase Levothyroxine to po daily, f/u TSH in 12 weeks.

## 2012-10-02 ENCOUNTER — Non-Acute Institutional Stay (SKILLED_NURSING_FACILITY): Payer: Medicare Other | Admitting: Nurse Practitioner

## 2012-10-02 ENCOUNTER — Encounter: Payer: Self-pay | Admitting: Nurse Practitioner

## 2012-10-02 DIAGNOSIS — I10 Essential (primary) hypertension: Secondary | ICD-10-CM

## 2012-10-02 DIAGNOSIS — L299 Pruritus, unspecified: Secondary | ICD-10-CM

## 2012-10-02 DIAGNOSIS — E039 Hypothyroidism, unspecified: Secondary | ICD-10-CM

## 2012-10-02 DIAGNOSIS — I5021 Acute systolic (congestive) heart failure: Secondary | ICD-10-CM

## 2012-10-02 DIAGNOSIS — D649 Anemia, unspecified: Secondary | ICD-10-CM

## 2012-10-02 DIAGNOSIS — N184 Chronic kidney disease, stage 4 (severe): Secondary | ICD-10-CM

## 2012-10-02 NOTE — Assessment & Plan Note (Signed)
Controlled on Metoprolol 50mg bid.                

## 2012-10-02 NOTE — Assessment & Plan Note (Signed)
Compensated clinically, BNP 638.9 08/14/12, takes Furosemide 40mg  bid

## 2012-10-02 NOTE — Progress Notes (Signed)
Patient ID: [name], female   DOB: 06/15/1916, 79 y.o.   MRN: [id number] Code Status: DNR  Allergies  Allergen Reactions  . Anaprox (Naproxen Sodium)     Unknown reaction    Chief Complaint  Patient presents with  . Medical Managment of Chronic Issues    pruritus.     HPI: Patient is a 79 y.o. female seen in the SNF at [facility]  today for evaluation of pruritus and other chronic medical conditions.  Problem List Items Addressed This Visit   Acute systolic heart failure - Primary     Compensated clinically, BNP 638.9 08/14/12, takes Furosemide 40mg  bid    Anemia (Chronic)     Last Hgb 10.8 08/14/12.    CKD (chronic kidney disease), stage IV (Chronic)     Persists, creat near 2(1.67 08/14/12), the patient desires no fluid restriction, will monitor weight.         HTN (hypertension) (Chronic)     Controlled on Metoprolol 50mg  bid.         Pruritus     No apparent skin rash noted except external non inflamed hemorrhoids-Preparation H available to her and mildly redness in perineal area-Mycolog II available to her--one staff member stated certain kind of body lotion seems cause her itch. This is not a new problem--Atarax was stopped due to non used in the past 60 days.     Unspecified hypothyroidism (Chronic)     Levothyroxine to po daily,  TSH 3.843 08/18/12           Review of Systems:  Review of Systems  Constitutional: Negative for fever, chills, weight loss, malaise/fatigue and diaphoresis.  HENT: Positive for hearing loss. Negative for congestion, sore throat and neck pain.   Eyes: Negative for pain, discharge and redness.  Respiratory: Positive for cough. Negative for sputum production, shortness of breath and wheezing.   Cardiovascular: Positive for leg swelling and PND. Negative for chest pain, palpitations, orthopnea and claudication.  Gastrointestinal: Negative for heartburn, nausea, vomiting, abdominal pain, diarrhea, constipation, blood in  stool and melena.  Genitourinary: Positive for frequency (incontinent of bladder). Negative for dysuria, urgency, hematuria and flank pain.  Musculoskeletal: Positive for back pain. Negative for myalgias, joint pain and falls.  Skin: Positive for itching. Negative for rash.       External hemorrhoids-not inflamed. Perineal area mild redness.   Neurological: Negative for dizziness, tingling, tremors, sensory change, speech change, focal weakness, seizures, loss of consciousness, weakness and headaches.  Endo/Heme/Allergies: Negative for environmental allergies and polydipsia. Does not bruise/bleed easily.  Psychiatric/Behavioral: Positive for memory loss. Negative for depression and hallucinations. The patient is not nervous/anxious and does not have insomnia.      Past Medical History  Diagnosis Date  . Hypothyroidism   . Depression   . Migraines   . Dental pulp degeneration   . Corns and callus   . Gait disorder   . Urinary incontinence   . Skin cancer   . Cataract   . Skin cancer   . Insomnia    Past Surgical History  Procedure Laterality Date  . Total hip arthroplasty    . Cataract extraction    . Abdominal hysterectomy     Social History:   reports that she has never smoked. She has never used smokeless tobacco. She reports that she does not drink alcohol or use illicit drugs.  Family History  Problem Relation Age of Onset  . Colon cancer Neg Hx  Medications: Reviewed at [facility]   Physical Exam: Physical Exam  Constitutional: She appears well-developed.  HENT:  Head: Normocephalic.  Nose: Nose normal.  Eyes: Conjunctivae and EOM are normal. Pupils are equal, round, and reactive to light.  Neck: Neck supple. No JVD present. No thyromegaly present.  Cardiovascular: Normal rate and normal heart sounds.   Pulmonary/Chest: Breath sounds normal.  Abdominal: Soft. Bowel sounds are normal.  Musculoskeletal: She exhibits no edema.  Lymphadenopathy:    She has no  cervical adenopathy.  Neurological: She is alert. She has normal reflexes. No cranial nerve deficit.  Skin: Skin is warm and dry. No abrasion noted. Rash: dry and scaly. No erythema.  External hemorrhoids-not inflamed. Mild redness in her perineal area  Psychiatric: She has a normal mood and affect.    Filed Vitals:   10/02/12 1104  BP: 153/65  Pulse: 74  Temp: 98.2 F (36.8 C)  TempSrc: Tympanic  Resp: 20      Labs reviewed: Basic Metabolic Panel:  Recent Labs  16/10/96 0545 03/26/12 0600 03/27/12 0615 05/26/12 06/19/12 08/14/12 08/18/12  NA 145 145 144  --  145 142  --   K 4.3 3.4* 4.2  --  4.2 4.3  --   CL 103 101 102  --   --   --   --   CO2 27 33* 30  --   --   --   --   GLUCOSE 131* 109* 101*  --   --   --   --   BUN 71* 73* 79*  --  65* 49*  --   CREATININE 1.87* 1.91* 1.97*  --  1.9* 1.7*  --   CALCIUM 8.8 9.2 9.5  --   --   --   --   MG 2.2 2.2 2.2  --   --   --   --   TSH  --   --   --  5.38  --  6.83* 3.84   Liver Function Tests:  Recent Labs  08/14/12  AST 19  ALT 13  ALKPHOS 77    CBC:  Recent Labs  03/23/12 0057  03/23/12 0558 03/24/12 0944 03/26/12 0600 05/26/12 08/14/12  WBC 11.7*  --  11.1* 8.2 9.1 6.3 7.7  NEUTROABS 9.5*  --   --   --   --   --   --   HGB 10.5*  < > 10.7* 10.0* 11.5* 11.2* 10.8*  HCT 33.7*  < > 34.0* 33.1* 36.6 34* 34*  MCV 97.4  --  98.0 98.5 96.1  --   --   PLT 241  --  238 239 280 247 209  < > = values in this interval not displayed.      Assessment/Plan Acute systolic heart failure Compensated clinically, BNP 638.9 08/14/12, takes Furosemide 40mg  bid  Anemia Last Hgb 10.8 08/14/12.  CKD (chronic kidney disease), stage IV Persists, creat near 2(1.67 08/14/12), the patient desires no fluid restriction, will monitor weight.       HTN (hypertension) Controlled on Metoprolol 50mg  bid.       Unspecified hypothyroidism Levothyroxine to po daily,  TSH 3.843 08/18/12      Pruritus No apparent  skin rash noted except external non inflamed hemorrhoids-Preparation H available to her and mildly redness in perineal area-Mycolog II available to her--one staff member stated certain kind of body lotion seems cause her itch. This is not a new problem--Atarax was stopped due to non used in  the past 60 days.     Family/ Staff Communication: observe the patient.   Goals of Care: SNF  Labs/tests ordered: none

## 2012-10-02 NOTE — Assessment & Plan Note (Signed)
Persists, creat near 2(1.67 08/14/12), the patient desires no fluid restriction, will monitor weight.

## 2012-10-02 NOTE — Assessment & Plan Note (Signed)
No apparent skin rash noted except external non inflamed hemorrhoids-Preparation H available to her and mildly redness in perineal area-Mycolog II available to her--one staff member stated certain kind of body lotion seems cause her itch. This is not a new problem--Atarax was stopped due to non used in the past 60 days.

## 2012-10-02 NOTE — Assessment & Plan Note (Signed)
Levothyroxine to po daily,  TSH 3.843 08/18/12

## 2012-10-02 NOTE — Assessment & Plan Note (Signed)
Last Hgb 10.8 08/14/12.

## 2012-10-23 ENCOUNTER — Encounter: Payer: Self-pay | Admitting: Internal Medicine

## 2012-10-28 ENCOUNTER — Encounter: Payer: Self-pay | Admitting: Nurse Practitioner

## 2012-10-28 ENCOUNTER — Non-Acute Institutional Stay (SKILLED_NURSING_FACILITY): Payer: Medicare Other | Admitting: Nurse Practitioner

## 2012-10-28 DIAGNOSIS — E039 Hypothyroidism, unspecified: Secondary | ICD-10-CM

## 2012-10-28 DIAGNOSIS — I10 Essential (primary) hypertension: Secondary | ICD-10-CM

## 2012-10-28 DIAGNOSIS — K219 Gastro-esophageal reflux disease without esophagitis: Secondary | ICD-10-CM

## 2012-10-28 DIAGNOSIS — I5021 Acute systolic (congestive) heart failure: Secondary | ICD-10-CM

## 2012-10-28 NOTE — Assessment & Plan Note (Signed)
Takes Levothyroxine since 05/27/12, f/u TSH in 12 wks scheduled 6.833 08/14/12. Last TSH 5.383 05/26/12--increased Levothyroxine to po daily, f/u TSH in 12 weeks.

## 2012-10-28 NOTE — Assessment & Plan Note (Signed)
Stable on Protonix 40 mg daily. 

## 2012-10-28 NOTE — Assessment & Plan Note (Signed)
Controlled on Metoprolol 50mg bid.                

## 2012-10-28 NOTE — Assessment & Plan Note (Addendum)
Weights stable. Furosemide 40mg  bid

## 2012-10-28 NOTE — Progress Notes (Signed)
Patient ID: [name], female   DOB: 1917-02-13, 79 y.o.   MRN: [id number] Code Status: DNR  Allergies  Allergen Reactions  . Anaprox [Naproxen Sodium]     Unknown reaction    Chief Complaint  Patient presents with  . Medical Managment of Chronic Issues    HPI: Patient is a 79 y.o. female seen in the SNF at [facility] today for evaluation of her chronic medical conditions.  Problem List Items Addressed This Visit   Acute systolic heart failure - Primary     Weights stable. Furosemide 40mg  bid    GERD (gastroesophageal reflux disease)     Stable on Protonix 40mg  daily      HTN (hypertension) (Chronic)     Controlled on Metoprolol 50mg  bid.           Unspecified hypothyroidism (Chronic)     Takes Levothyroxine since 05/27/12, f/u TSH in 12 wks scheduled 6.833 08/14/12. Last TSH 5.383 05/26/12--increased Levothyroxine to po daily, f/u TSH in 12 weeks.            Review of Systems:  Review of Systems  Constitutional: Negative for fever, chills, weight loss, malaise/fatigue and diaphoresis.  HENT: Positive for hearing loss. Negative for congestion, sore throat and neck pain.   Eyes: Negative for pain, discharge and redness.  Respiratory: Positive for cough. Negative for sputum production, shortness of breath and wheezing.   Cardiovascular: Positive for leg swelling and PND. Negative for chest pain, palpitations, orthopnea and claudication.  Gastrointestinal: Negative for heartburn, nausea, vomiting, abdominal pain, diarrhea, constipation, blood in stool and melena.  Genitourinary: Positive for frequency (incontinent of bladder). Negative for dysuria, urgency, hematuria and flank pain.  Musculoskeletal: Positive for back pain. Negative for myalgias, joint pain and falls.  Skin: Positive for itching. Negative for rash.       External hemorrhoids-not inflamed. Perineal area mild redness.   Neurological: Negative for dizziness, tingling, tremors,  sensory change, speech change, focal weakness, seizures, loss of consciousness, weakness and headaches.  Endo/Heme/Allergies: Negative for environmental allergies and polydipsia. Does not bruise/bleed easily.  Psychiatric/Behavioral: Positive for memory loss. Negative for depression and hallucinations. The patient is not nervous/anxious and does not have insomnia.      Past Medical History  Diagnosis Date  . Hypothyroidism   . Depression   . Migraines   . Dental pulp degeneration   . Corns and callus   . Gait disorder   . Urinary incontinence   . Skin cancer   . Cataract   . Skin cancer   . Insomnia    Past Surgical History  Procedure Laterality Date  . Total hip arthroplasty    . Cataract extraction    . Abdominal hysterectomy     Social History:   reports that she has never smoked. She has never used smokeless tobacco. She reports that she does not drink alcohol or use illicit drugs.  Family History  Problem Relation Age of Onset  . Colon cancer Neg Hx     Medications: Reviewed at [facility]   Physical Exam: Physical Exam  Constitutional: She appears well-developed.  HENT:  Head: Normocephalic.  Nose: Nose normal.  Eyes: Conjunctivae and EOM are normal. Pupils are equal, round, and reactive to light.  Neck: Neck supple. No JVD present. No thyromegaly present.  Cardiovascular: Normal rate and normal heart sounds.   Pulmonary/Chest: Breath sounds normal.  Abdominal: Soft. Bowel sounds are normal.  Musculoskeletal: She exhibits no edema.  Lymphadenopathy:  She has no cervical adenopathy.  Neurological: She is alert. She has normal reflexes. No cranial nerve deficit.  Skin: Skin is warm and dry. No abrasion noted. Rash: dry and scaly. No erythema.  External hemorrhoids-not inflamed. Mild redness in her perineal area  Psychiatric: She has a normal mood and affect.    Filed Vitals:   10/28/12 1654  BP: 142/66  Pulse: 88  Temp: 97.2 F (36.2 C)  TempSrc: Tympanic   Resp: 18  SpO2: 97%      Labs reviewed: Basic Metabolic Panel:  Recent Labs  40/98/11 0545 03/26/12 0600 03/27/12 0615 05/26/12 06/19/12 08/14/12 08/18/12  NA 145 145 144  --  145 142  --   K 4.3 3.4* 4.2  --  4.2 4.3  --   CL 103 101 102  --   --   --   --   CO2 27 33* 30  --   --   --   --   GLUCOSE 131* 109* 101*  --   --   --   --   BUN 71* 73* 79*  --  65* 49*  --   CREATININE 1.87* 1.91* 1.97*  --  1.9* 1.7*  --   CALCIUM 8.8 9.2 9.5  --   --   --   --   MG 2.2 2.2 2.2  --   --   --   --   TSH  --   --   --  5.38  --  6.83* 3.84   Liver Function Tests:  Recent Labs  08/14/12  AST 19  ALT 13  ALKPHOS 77   CBC:  Recent Labs  03/23/12 0057  03/23/12 0558 03/24/12 0944 03/26/12 0600 05/26/12 08/14/12  WBC 11.7*  --  11.1* 8.2 9.1 6.3 7.7  NEUTROABS 9.5*  --   --   --   --   --   --   HGB 10.5*  < > 10.7* 10.0* 11.5* 11.2* 10.8*  HCT 33.7*  < > 34.0* 33.1* 36.6 34* 34*  MCV 97.4  --  98.0 98.5 96.1  --   --   PLT 241  --  238 239 280 247 209  < > = values in this interval not displayed.   Past Procedures: 03/18/12 CXR no evidence of acute infiltrate or residual congestive heart failure is noted.    Assessment/Plan Acute systolic heart failure Weights stable. Furosemide 40mg  bid  Unspecified hypothyroidism Takes Levothyroxine since 05/27/12, f/u TSH in 12 wks scheduled 6.833 08/14/12. Last TSH 5.383 05/26/12--increased Levothyroxine to po daily, f/u TSH in 12 weeks.       GERD (gastroesophageal reflux disease) Stable on Protonix 40mg  daily    HTN (hypertension) Controlled on Metoprolol 50mg  bid.           Family/ Staff Communication: observe the patient.   Goals of Care: SNF  Labs/tests ordered: none

## 2012-10-30 LAB — CBC AND DIFFERENTIAL
HCT: 33 % — AB (ref 36–46)
Hemoglobin: 10.8 g/dL — AB (ref 12.0–16.0)

## 2012-10-30 LAB — BASIC METABOLIC PANEL
BUN: 54 mg/dL — AB (ref 4–21)
Potassium: 4.1 mmol/L (ref 3.4–5.3)
Sodium: 140 mmol/L (ref 137–147)

## 2012-11-17 LAB — TSH: TSH: 3.29 u[IU]/mL (ref 0.41–5.90)

## 2012-11-25 ENCOUNTER — Encounter: Payer: Self-pay | Admitting: Nurse Practitioner

## 2012-11-25 ENCOUNTER — Non-Acute Institutional Stay (SKILLED_NURSING_FACILITY): Payer: Medicare Other | Admitting: Nurse Practitioner

## 2012-11-25 DIAGNOSIS — K5900 Constipation, unspecified: Secondary | ICD-10-CM | POA: Insufficient documentation

## 2012-11-25 DIAGNOSIS — I5020 Unspecified systolic (congestive) heart failure: Secondary | ICD-10-CM

## 2012-11-25 DIAGNOSIS — I10 Essential (primary) hypertension: Secondary | ICD-10-CM

## 2012-11-25 DIAGNOSIS — E039 Hypothyroidism, unspecified: Secondary | ICD-10-CM

## 2012-11-25 DIAGNOSIS — L259 Unspecified contact dermatitis, unspecified cause: Secondary | ICD-10-CM

## 2012-11-25 DIAGNOSIS — N184 Chronic kidney disease, stage 4 (severe): Secondary | ICD-10-CM

## 2012-11-25 DIAGNOSIS — K219 Gastro-esophageal reflux disease without esophagitis: Secondary | ICD-10-CM

## 2012-11-25 DIAGNOSIS — L309 Dermatitis, unspecified: Secondary | ICD-10-CM

## 2012-11-25 NOTE — Progress Notes (Signed)
Patient ID: [name], female   DOB: 01-04-1917, 79 y.o.   MRN: [id number] Code Status: DNR  Allergies  Allergen Reactions  . Anaprox [Naproxen Sodium]     Unknown reaction    Chief Complaint  Patient presents with  . Medical Managment of Chronic Issues    patchy scaly skin rash    HPI: Patient is a 79 y.o. female seen in the SNF at [facility] today for evaluation of patchy scaly itching/burning rash on her arms and back and her other chronic medical conditions.  Problem List Items Addressed This Visit   CKD (chronic kidney disease), stage IV (Chronic)     Persists, creat near 1.7s          Dermatitis     Patchy scaly itching/burning rash noted on her arms and back, one place noted was near the left elbow, ? Psoriasis--Dermatology referral if POA desires.  Atarax 25mg  prn started for symptomatic management.     GERD (gastroesophageal reflux disease)     Stable on Protonix 40mg  daily        HTN (hypertension) (Chronic)     Controlled on Metoprolol 50mg  bid.             Systolic CHF (Chronic)     Compensated with Furosemide 40mg  bid with prn Furosemide 40mg  for wt gain 2-3Ibs/24hr or 3-5Ib/week. BNP 638.9 08/14/12        Unspecified constipation     Stable on Senna S II bid.     Unspecified hypothyroidism - Primary (Chronic)     Been taking Levothyroxine110mcg po daily for the past 3 mos, TSH 3.29 11/17/12             Review of Systems:  Review of Systems  Constitutional: Negative for fever, chills, weight loss, malaise/fatigue and diaphoresis.  HENT: Positive for hearing loss. Negative for congestion, sore throat and neck pain.   Eyes: Negative for pain, discharge and redness.  Respiratory: Positive for cough. Negative for sputum production, shortness of breath and wheezing.   Cardiovascular: Positive for leg swelling and PND. Negative for chest pain, palpitations, orthopnea and claudication.  Gastrointestinal: Negative for heartburn,  nausea, vomiting, abdominal pain, diarrhea, constipation, blood in stool and melena.  Genitourinary: Positive for frequency (incontinent of bladder). Negative for dysuria, urgency, hematuria and flank pain.  Musculoskeletal: Positive for back pain. Negative for myalgias, joint pain and falls.  Skin: Positive for itching. Negative for rash.       External hemorrhoids-not inflamed. Perineal area mild redness. Patchy scaly itching/burning rash noted arms/back.   Neurological: Negative for dizziness, tingling, tremors, sensory change, speech change, focal weakness, seizures, loss of consciousness, weakness and headaches.  Endo/Heme/Allergies: Negative for environmental allergies and polydipsia. Does not bruise/bleed easily.  Psychiatric/Behavioral: Positive for memory loss. Negative for depression and hallucinations. The patient is not nervous/anxious and does not have insomnia.      Past Medical History  Diagnosis Date  . Hypothyroidism   . Depression   . Migraines   . Dental pulp degeneration   . Corns and callus   . Gait disorder   . Urinary incontinence   . Skin cancer   . Cataract   . Skin cancer   . Insomnia    Past Surgical History  Procedure Laterality Date  . Total hip arthroplasty    . Cataract extraction    . Abdominal hysterectomy     Social History:   reports that she has never smoked. She has never  used smokeless tobacco. She reports that she does not drink alcohol or use illicit drugs.  Family History  Problem Relation Age of Onset  . Colon cancer Neg Hx     Medications: Reviewed at [facility]   Physical Exam: Physical Exam  Constitutional: She appears well-developed.  HENT:  Head: Normocephalic.  Nose: Nose normal.  Eyes: Conjunctivae and EOM are normal. Pupils are equal, round, and reactive to light.  Neck: Neck supple. No JVD present. No thyromegaly present.  Cardiovascular: Normal rate and normal heart sounds.   Pulmonary/Chest: Breath sounds normal.   Abdominal: Soft. Bowel sounds are normal.  Musculoskeletal: She exhibits no edema.  Lymphadenopathy:    She has no cervical adenopathy.  Neurological: She is alert. She has normal reflexes. No cranial nerve deficit.  Skin: Skin is warm and dry. No abrasion noted. Rash: dry and scaly. No erythema.  External hemorrhoids-not inflamed. Patchy, scaly, itching/burning rash noted on her arms and back.   Psychiatric: She has a normal mood and affect.    Filed Vitals:   11/25/12 1210  BP: 176/66  Pulse: 78  Temp: 98.2 F (36.8 C)  TempSrc: Tympanic  Resp: 20      Labs reviewed: Basic Metabolic Panel:  Recent Labs  29/56/21 0545 03/26/12 0600 03/27/12 0615  06/19/12 08/14/12 08/18/12 10/30/12 11/17/12  NA 145 145 144  --  145 142  --  140  --   K 4.3 3.4* 4.2  --  4.2 4.3  --  4.1  --   CL 103 101 102  --   --   --   --   --   --   CO2 27 33* 30  --   --   --   --   --   --   GLUCOSE 131* 109* 101*  --   --   --   --   --   --   BUN 71* 73* 79*  --  65* 49*  --  54*  --   CREATININE 1.87* 1.91* 1.97*  --  1.9* 1.7*  --  1.7*  --   CALCIUM 8.8 9.2 9.5  --   --   --   --   --   --   MG 2.2 2.2 2.2  --   --   --   --   --   --   TSH  --   --   --   < >  --  6.83* 3.84  --  3.29  < > = values in this interval not displayed. Liver Function Tests:  Recent Labs  08/14/12  AST 19  ALT 13  ALKPHOS 77   CBC:  Recent Labs  03/23/12 0057  03/23/12 0558 03/24/12 0944 03/26/12 0600 05/26/12 08/14/12 10/30/12  WBC 11.7*  --  11.1* 8.2 9.1 6.3 7.7 7.2  NEUTROABS 9.5*  --   --   --   --   --   --   --   HGB 10.5*  < > 10.7* 10.0* 11.5* 11.2* 10.8* 10.8*  HCT 33.7*  < > 34.0* 33.1* 36.6 34* 34* 33*  MCV 97.4  --  98.0 98.5 96.1  --   --   --   PLT 241  --  238 239 280 247 209 220  < > = values in this interval not displayed.   Past Procedures: 03/18/12 CXR no evidence of acute infiltrate or residual congestive heart failure is noted.  Assessment/Plan Unspecified  hypothyroidism Been taking Levothyroxine144mcg po daily for the past 3 mos, TSH 3.29 11/17/12        GERD (gastroesophageal reflux disease) Stable on Protonix 40mg  daily      Systolic CHF Compensated with Furosemide 40mg  bid with prn Furosemide 40mg  for wt gain 2-3Ibs/24hr or 3-5Ib/week. BNP 638.9 08/14/12      HTN (hypertension) Controlled on Metoprolol 50mg  bid.           CKD (chronic kidney disease), stage IV Persists, creat near 1.7s        Unspecified constipation Stable on Senna S II bid.   Dermatitis Patchy scaly itching/burning rash noted on her arms and back, one place noted was near the left elbow, ? Psoriasis--Dermatology referral if POA desires.  Atarax 25mg  prn started for symptomatic management.     Family/ Staff Communication: observe the patient.  Dermatology referral when the patient and her POA desire.   Goals of Care: SNF  Labs/tests ordered: none

## 2012-11-25 NOTE — Assessment & Plan Note (Signed)
Patchy scaly itching/burning rash noted on her arms and back, one place noted was near the left elbow, ? Psoriasis--Dermatology referral if POA desires.  Atarax 25mg  prn started for symptomatic management.

## 2012-11-25 NOTE — Assessment & Plan Note (Signed)
Persists, creat near 1.7s

## 2012-11-25 NOTE — Assessment & Plan Note (Signed)
Controlled on Metoprolol 50mg bid.                

## 2012-11-25 NOTE — Assessment & Plan Note (Signed)
Been taking Levothyroxine174mcg po daily for the past 3 mos, TSH 3.29 11/17/12

## 2012-11-25 NOTE — Assessment & Plan Note (Signed)
Stable on Senna S II bid.          

## 2012-11-25 NOTE — Assessment & Plan Note (Signed)
Compensated with Furosemide 40mg  bid with prn Furosemide 40mg  for wt gain 2-3Ibs/24hr or 3-5Ib/week. BNP 638.9 08/14/12

## 2012-11-25 NOTE — Assessment & Plan Note (Signed)
Stable on Protonix 40 mg daily. 

## 2012-12-16 ENCOUNTER — Encounter: Payer: Self-pay | Admitting: Nurse Practitioner

## 2012-12-16 ENCOUNTER — Non-Acute Institutional Stay (SKILLED_NURSING_FACILITY): Payer: Medicare Other | Admitting: Nurse Practitioner

## 2012-12-16 DIAGNOSIS — N184 Chronic kidney disease, stage 4 (severe): Secondary | ICD-10-CM

## 2012-12-16 DIAGNOSIS — I10 Essential (primary) hypertension: Secondary | ICD-10-CM

## 2012-12-16 DIAGNOSIS — I480 Paroxysmal atrial fibrillation: Secondary | ICD-10-CM

## 2012-12-16 DIAGNOSIS — E039 Hypothyroidism, unspecified: Secondary | ICD-10-CM

## 2012-12-16 DIAGNOSIS — F329 Major depressive disorder, single episode, unspecified: Secondary | ICD-10-CM

## 2012-12-16 DIAGNOSIS — I4891 Unspecified atrial fibrillation: Secondary | ICD-10-CM

## 2012-12-16 DIAGNOSIS — K5900 Constipation, unspecified: Secondary | ICD-10-CM

## 2012-12-16 DIAGNOSIS — L299 Pruritus, unspecified: Secondary | ICD-10-CM

## 2012-12-16 DIAGNOSIS — I5020 Unspecified systolic (congestive) heart failure: Secondary | ICD-10-CM

## 2012-12-16 DIAGNOSIS — J9690 Respiratory failure, unspecified, unspecified whether with hypoxia or hypercapnia: Secondary | ICD-10-CM

## 2012-12-16 DIAGNOSIS — J9600 Acute respiratory failure, unspecified whether with hypoxia or hypercapnia: Secondary | ICD-10-CM

## 2012-12-16 DIAGNOSIS — K219 Gastro-esophageal reflux disease without esophagitis: Secondary | ICD-10-CM

## 2012-12-16 NOTE — Assessment & Plan Note (Signed)
Controlled on Metoprolol 50mg bid.                

## 2012-12-16 NOTE — Progress Notes (Signed)
Patient ID: [name], female   DOB: [birth year], 79 y.o.   MRN: [id number] Code Status: DNR  Allergies  Allergen Reactions  . Anaprox [Naproxen Sodium]     Unknown reaction    Chief Complaint  Patient presents with  . Medical Managment of Chronic Issues    HPI: Patient is a 79 y.o. female seen in the SNF at [facility] today for evaluation of her other chronic medical conditions.      Problem List Items Addressed This Visit   CKD (chronic kidney disease), stage IV (Chronic)     Persists, creat near 1.7s            Depression (Chronic)     Stable w/o mood stabilizer.     GERD (gastroesophageal reflux disease)     Stable on Protonix 40mg  daily          HTN (hypertension) (Chronic)     Controlled on Metoprolol 50mg  bid.               Relevant Medications      metoprolol (LOPRESSOR) 50 MG tablet   PAF (paroxysmal atrial fibrillation) - Primary (Chronic)     Rate controlled Metoprolol 50mg  bid.       Relevant Medications      metoprolol (LOPRESSOR) 50 MG tablet   Pruritus     No apparent skin rash noted. This is not a new problem--Atarax helped.       Respiratory failure     O2 dependent.     Systolic CHF (Chronic)     Compensated with Furosemide 40mg  bid with prn Furosemide 40mg  for wt gain 2-3Ibs/24hr or 3-5Ib/week. BNP 638.9 08/14/12          Relevant Medications      metoprolol (LOPRESSOR) 50 MG tablet   Unspecified constipation     Stable on Senna S II bid.       Unspecified hypothyroidism (Chronic)     Last TSH 5.383 05/26/12--increased Levothyroxine to po daily, f/u TSH in 12 weeks-3.29 11/12/12          Relevant Medications      levothyroxine (SYNTHROID, LEVOTHROID) 150 MCG tablet      metoprolol (LOPRESSOR) 50 MG tablet      Review of Systems:  Review of Systems  Constitutional: Negative for fever, chills, weight loss, malaise/fatigue and diaphoresis.  HENT: Positive for hearing loss. Negative for  congestion, sore throat and neck pain.   Eyes: Negative for pain, discharge and redness.  Respiratory: Positive for cough. Negative for sputum production, shortness of breath and wheezing.   Cardiovascular: Positive for leg swelling and PND. Negative for chest pain, palpitations, orthopnea and claudication.  Gastrointestinal: Negative for heartburn, nausea, vomiting, abdominal pain, diarrhea, constipation, blood in stool and melena.  Genitourinary: Positive for frequency (incontinent of bladder). Negative for dysuria, urgency, hematuria and flank pain.  Musculoskeletal: Positive for back pain. Negative for myalgias, joint pain and falls.  Skin: Positive for itching. Negative for rash.  Neurological: Negative for dizziness, tingling, tremors, sensory change, speech change, focal weakness, seizures, loss of consciousness, weakness and headaches.  Endo/Heme/Allergies: Negative for environmental allergies and polydipsia. Does not bruise/bleed easily.  Psychiatric/Behavioral: Positive for memory loss. Negative for depression and hallucinations. The patient is not nervous/anxious and does not have insomnia.      Past Medical History  Diagnosis Date  . Hypothyroidism   . Depression   . Migraines   . Dental pulp degeneration   . Corns  and callus   . Gait disorder   . Urinary incontinence   . Skin cancer   . Cataract   . Skin cancer   . Insomnia    Past Surgical History  Procedure Laterality Date  . Total hip arthroplasty    . Cataract extraction    . Abdominal hysterectomy     Social History:   reports that she has never smoked. She has never used smokeless tobacco. She reports that she does not drink alcohol or use illicit drugs.  Family History  Problem Relation Age of Onset  . Colon cancer Neg Hx     Medications:   Medication List       This list is accurate as of: 12/16/12  1:35 PM.  Always use your most recent med list.               acetaminophen 500 MG tablet   Commonly known as:  TYLENOL  Take 1,000 mg by mouth every 6 (six) hours as needed. For pain. Bid     albuterol (5 MG/ML) 0.5% nebulizer solution  Commonly known as:  PROVENTIL  Take 2.5 mg by nebulization every 4 (four) hours as needed. wheezing     aspirin EC 81 MG tablet  Take 81 mg by mouth daily.     bisacodyl 5 MG EC tablet  Commonly known as:  DULCOLAX  Take 5-10 mg by mouth daily as needed. For constipation     brimonidine-timolol 0.2-0.5 % ophthalmic solution  Commonly known as:  COMBIGAN  Place 1 drop into both eyes every 12 (twelve) hours.     Cholecalciferol 1000 UNITS tablet  Take 1,000 Units by mouth 2 (two) times daily.     clopidogrel 75 MG tablet  Commonly known as:  PLAVIX  Take 75 mg by mouth daily.     DUONEB IN  Inhale 1 ampule into the lungs every 6 (six) hours as needed. wheezing     furosemide 20 MG tablet  Commonly known as:  LASIX  Take 2 tablets (40 mg total) by mouth 2 (two) times daily.     hydrOXYzine 25 MG tablet  Commonly known as:  ATARAX/VISTARIL  Take 12.5 mg by mouth every evening.     latanoprost 0.005 % ophthalmic solution  Commonly known as:  XALATAN  Place 1 drop into both eyes at bedtime.     levothyroxine 150 MCG tablet  Commonly known as:  SYNTHROID, LEVOTHROID  Take 150 mcg by mouth daily before breakfast.     metoprolol 50 MG tablet  Commonly known as:  LOPRESSOR  Take 50 mg by mouth 2 (two) times daily.     nitroGLYCERIN 0.4 MG SL tablet  Commonly known as:  NITROSTAT  Place 0.4 mg under the tongue once.     pantoprazole 40 MG tablet  Commonly known as:  PROTONIX  Take 40 mg by mouth daily.     potassium chloride SA 20 MEQ tablet  Commonly known as:  K-DUR,KLOR-CON  Take 1 tablet (20 mEq total) by mouth 2 (two) times daily.     PRESERVISION AREDS 2 PO  Take 1 capsule by mouth 2 (two) times daily.     saccharomyces boulardii 250 MG capsule  Commonly known as:  FLORASTOR  Take 250 mg by mouth 2 (two) times  daily.     senna-docusate 8.6-50 MG per tablet  Commonly known as:  Senokot-S  Take 1 tablet by mouth 2 (two) times daily.  Physical Exam: Physical Exam  Constitutional: She appears well-developed.  HENT:  Head: Normocephalic.  Nose: Nose normal.  Eyes: Conjunctivae and EOM are normal. Pupils are equal, round, and reactive to light.  Neck: Neck supple. No JVD present. No thyromegaly present.  Cardiovascular: Normal rate and normal heart sounds.   Pulmonary/Chest: Breath sounds normal.  Abdominal: Soft. Bowel sounds are normal.  Musculoskeletal: She exhibits no edema.  Lymphadenopathy:    She has no cervical adenopathy.  Neurological: She is alert. She has normal reflexes. No cranial nerve deficit.  Skin: Skin is warm and dry. No abrasion noted. Rash: dry and scaly. No erythema.  Psychiatric: She has a normal mood and affect.    Filed Vitals:   12/16/12 1303  BP: 136/78  Pulse: 74  Temp: 99.4 F (37.4 C)  TempSrc: Tympanic  Resp: 20      Labs reviewed: Basic Metabolic Panel:  Recent Labs  16/10/96 0545 03/26/12 0600 03/27/12 0615  06/19/12 08/14/12 08/18/12 10/30/12 11/17/12  NA 145 145 144  --  145 142  --  140  --   K 4.3 3.4* 4.2  --  4.2 4.3  --  4.1  --   CL 103 101 102  --   --   --   --   --   --   CO2 27 33* 30  --   --   --   --   --   --   GLUCOSE 131* 109* 101*  --   --   --   --   --   --   BUN 71* 73* 79*  --  65* 49*  --  54*  --   CREATININE 1.87* 1.91* 1.97*  --  1.9* 1.7*  --  1.7*  --   CALCIUM 8.8 9.2 9.5  --   --   --   --   --   --   MG 2.2 2.2 2.2  --   --   --   --   --   --   TSH  --   --   --   < >  --  6.83* 3.84  --  3.29  < > = values in this interval not displayed. Liver Function Tests:  Recent Labs  08/14/12  AST 19  ALT 13  ALKPHOS 77   CBC:  Recent Labs  03/23/12 0057  03/23/12 0558 03/24/12 0944 03/26/12 0600 05/26/12 08/14/12 10/30/12  WBC 11.7*  --  11.1* 8.2 9.1 6.3 7.7 7.2  NEUTROABS 9.5*  --   --    --   --   --   --   --   HGB 10.5*  < > 10.7* 10.0* 11.5* 11.2* 10.8* 10.8*  HCT 33.7*  < > 34.0* 33.1* 36.6 34* 34* 33*  MCV 97.4  --  98.0 98.5 96.1  --   --   --   PLT 241  --  238 239 280 247 209 220  < > = values in this interval not displayed.   Past Procedures: 03/18/12 CXR no evidence of acute infiltrate or residual congestive heart failure is noted.    Assessment/Plan Unspecified hypothyroidism Last TSH 5.383 05/26/12--increased Levothyroxine to po daily, f/u TSH in 12 weeks-3.29 11/12/12        Unspecified constipation Stable on Senna S II bid.     Systolic CHF Compensated with Furosemide 40mg  bid with prn Furosemide 40mg  for wt gain 2-3Ibs/24hr or 3-5Ib/week. BNP  638.9 08/14/12        Respiratory failure O2 dependent.   Pruritus No apparent skin rash noted. This is not a new problem--Atarax helped.     PAF (paroxysmal atrial fibrillation) Rate controlled Metoprolol 50mg  bid.     HTN (hypertension) Controlled on Metoprolol 50mg  bid.             GERD (gastroesophageal reflux disease) Stable on Protonix 40mg  daily        Depression Stable w/o mood stabilizer.   CKD (chronic kidney disease), stage IV Persists, creat near 1.7s            Family/ Staff Communication: observe the patient.  Dermatology referral when the patient and her POA desire.   Goals of Care: SNF  Labs/tests ordered: none

## 2012-12-16 NOTE — Assessment & Plan Note (Signed)
O2 dependent.

## 2012-12-16 NOTE — Assessment & Plan Note (Signed)
Last TSH 5.383 05/26/12--increased Levothyroxine to 150mcg po daily, f/u TSH in 12 weeks-3.29 11/12/12             

## 2012-12-16 NOTE — Assessment & Plan Note (Signed)
Stable on Senna S II bid.          

## 2012-12-16 NOTE — Assessment & Plan Note (Signed)
Rate controlled Metoprolol 50mg  bid.

## 2012-12-16 NOTE — Assessment & Plan Note (Signed)
No apparent skin rash noted. This is not a new problem--Atarax helped.

## 2012-12-16 NOTE — Assessment & Plan Note (Signed)
Stable on Protonix 40 mg daily. 

## 2012-12-16 NOTE — Assessment & Plan Note (Signed)
Persists, creat near 1.7s

## 2012-12-16 NOTE — Assessment & Plan Note (Signed)
Compensated with Furosemide 40mg  bid with prn Furosemide 40mg  for wt gain 2-3Ibs/24hr or 3-5Ib/week. BNP 638.9 08/14/12

## 2012-12-16 NOTE — Assessment & Plan Note (Signed)
Stable w/o mood stabilizer.

## 2013-01-13 ENCOUNTER — Encounter: Payer: Self-pay | Admitting: Nurse Practitioner

## 2013-01-13 ENCOUNTER — Non-Acute Institutional Stay (SKILLED_NURSING_FACILITY): Payer: Medicare Other | Admitting: Nurse Practitioner

## 2013-01-13 DIAGNOSIS — I5023 Acute on chronic systolic (congestive) heart failure: Secondary | ICD-10-CM

## 2013-01-13 DIAGNOSIS — E039 Hypothyroidism, unspecified: Secondary | ICD-10-CM

## 2013-01-13 DIAGNOSIS — I10 Essential (primary) hypertension: Secondary | ICD-10-CM

## 2013-01-13 DIAGNOSIS — L299 Pruritus, unspecified: Secondary | ICD-10-CM

## 2013-01-13 DIAGNOSIS — K219 Gastro-esophageal reflux disease without esophagitis: Secondary | ICD-10-CM

## 2013-01-13 DIAGNOSIS — I509 Heart failure, unspecified: Secondary | ICD-10-CM

## 2013-01-13 DIAGNOSIS — K5900 Constipation, unspecified: Secondary | ICD-10-CM

## 2013-01-13 NOTE — Assessment & Plan Note (Signed)
Saw Dermatology: Benadryl prn as prior and TAC topical bid for 3-4 weeks.

## 2013-01-13 NOTE — Assessment & Plan Note (Signed)
Controlled on Metoprolol 50mg bid.                

## 2013-01-13 NOTE — Assessment & Plan Note (Signed)
Last TSH 5.383 05/26/12--increased Levothyroxine to 150mcg po daily, f/u TSH in 12 weeks-3.29 11/12/12             

## 2013-01-13 NOTE — Progress Notes (Signed)
Patient ID: [name], female   DOB: 1916/04/02, 79 y.o.   MRN: [id number]  Code Status: DNR  Allergies  Allergen Reactions  . Anaprox [Naproxen Sodium]     Unknown reaction    Chief Complaint  Patient presents with  . Medical Managment of Chronic Issues    pruritus    HPI: Patient is a 79 y.o. female seen in the SNF at [facility] today for evaluation of her other chronic medical conditions.      Problem List Items Addressed This Visit   GERD (gastroesophageal reflux disease)     Stable on Protonix 40mg  daily            HTN (hypertension) - Primary (Chronic)     Controlled on Metoprolol 50mg  bid.               Pruritus     Saw Dermatology: Benadryl prn as prior and TAC topical bid for 3-4 weeks.     Systolic CHF, acute on chronic     Compensated with Furosemide 40mg  bid with prn Furosemide 40mg  for wt gain 2-3Ibs/24hr or 3-5Ib/week. BNP 638.9 08/14/12. Bun/creat 54/1.72 10/30/12            Unspecified constipation     Stable on Senna S II bid.         Unspecified hypothyroidism (Chronic)     Last TSH 5.383 05/26/12--increased Levothyroxine to po daily, f/u TSH in 12 weeks-3.29 11/12/12               Review of Systems:  Review of Systems  Constitutional: Negative for fever, chills, weight loss, malaise/fatigue and diaphoresis.  HENT: Positive for hearing loss. Negative for congestion and sore throat.   Eyes: Negative for pain, discharge and redness.  Respiratory: Positive for cough. Negative for sputum production, shortness of breath and wheezing.   Cardiovascular: Positive for leg swelling and PND. Negative for chest pain, palpitations, orthopnea and claudication.  Gastrointestinal: Negative for heartburn, nausea, vomiting, abdominal pain, diarrhea, constipation, blood in stool and melena.  Genitourinary: Positive for frequency (incontinent of bladder). Negative for dysuria, urgency, hematuria and flank pain.   Musculoskeletal: Positive for back pain. Negative for falls, joint pain, myalgias and neck pain.  Skin: Positive for itching. Negative for rash.  Neurological: Negative for dizziness, tingling, tremors, sensory change, speech change, focal weakness, seizures, loss of consciousness, weakness and headaches.  Endo/Heme/Allergies: Negative for environmental allergies and polydipsia. Does not bruise/bleed easily.  Psychiatric/Behavioral: Positive for memory loss. Negative for depression and hallucinations. The patient is not nervous/anxious and does not have insomnia.      Past Medical History  Diagnosis Date  . Hypothyroidism   . Depression   . Migraines   . Dental pulp degeneration   . Corns and callus   . Gait disorder   . Urinary incontinence   . Skin cancer   . Cataract   . Skin cancer   . Insomnia    Past Surgical History  Procedure Laterality Date  . Total hip arthroplasty    . Cataract extraction    . Abdominal hysterectomy     Social History:   reports that she has never smoked. She has never used smokeless tobacco. She reports that she does not drink alcohol or use illicit drugs.  Family History  Problem Relation Age of Onset  . Colon cancer Neg Hx     Medications:   Medication List       This list is  accurate as of: 01/13/13  2:03 PM.  Always use your most recent med list.               acetaminophen 500 MG tablet  Commonly known as:  TYLENOL  Take 1,000 mg by mouth every 6 (six) hours as needed. For pain. Bid     albuterol (5 MG/ML) 0.5% nebulizer solution  Commonly known as:  PROVENTIL  Take 2.5 mg by nebulization every 4 (four) hours as needed. wheezing     aspirin EC 81 MG tablet  Take 81 mg by mouth daily.     bisacodyl 5 MG EC tablet  Commonly known as:  DULCOLAX  Take 5-10 mg by mouth daily as needed. For constipation     brimonidine-timolol 0.2-0.5 % ophthalmic solution  Commonly known as:  COMBIGAN  Place 1 drop into both eyes every 12  (twelve) hours.     Cholecalciferol 1000 UNITS tablet  Take 1,000 Units by mouth 2 (two) times daily.     clopidogrel 75 MG tablet  Commonly known as:  PLAVIX  Take 75 mg by mouth daily.     DUONEB IN  Inhale 1 ampule into the lungs every 6 (six) hours as needed. wheezing     furosemide 20 MG tablet  Commonly known as:  LASIX  Take 2 tablets (40 mg total) by mouth 2 (two) times daily.     hydrOXYzine 25 MG tablet  Commonly known as:  ATARAX/VISTARIL  Take 12.5 mg by mouth every evening.     latanoprost 0.005 % ophthalmic solution  Commonly known as:  XALATAN  Place 1 drop into both eyes at bedtime.     levothyroxine 150 MCG tablet  Commonly known as:  SYNTHROID, LEVOTHROID  Take 150 mcg by mouth daily before breakfast.     metoprolol 50 MG tablet  Commonly known as:  LOPRESSOR  Take 50 mg by mouth 2 (two) times daily.     nitroGLYCERIN 0.4 MG SL tablet  Commonly known as:  NITROSTAT  Place 0.4 mg under the tongue once.     pantoprazole 40 MG tablet  Commonly known as:  PROTONIX  Take 40 mg by mouth daily.     potassium chloride SA 20 MEQ tablet  Commonly known as:  K-DUR,KLOR-CON  Take 1 tablet (20 mEq total) by mouth 2 (two) times daily.     PRESERVISION AREDS 2 PO  Take 1 capsule by mouth 2 (two) times daily.     saccharomyces boulardii 250 MG capsule  Commonly known as:  FLORASTOR  Take 250 mg by mouth 2 (two) times daily.     senna-docusate 8.6-50 MG per tablet  Commonly known as:  Senokot-S  Take 1 tablet by mouth 2 (two) times daily.         Physical Exam: Physical Exam  Constitutional: She appears well-developed.  HENT:  Head: Normocephalic.  Nose: Nose normal.  Eyes: Conjunctivae and EOM are normal. Pupils are equal, round, and reactive to light.  Neck: Neck supple. No JVD present. No thyromegaly present.  Cardiovascular: Normal rate and normal heart sounds.   Pulmonary/Chest: Breath sounds normal.  Abdominal: Soft. Bowel sounds are normal.   Musculoskeletal: She exhibits no edema.  Lymphadenopathy:    She has no cervical adenopathy.  Neurological: She is alert. She has normal reflexes. No cranial nerve deficit.  Skin: Skin is warm and dry. No abrasion noted. Rash: dry and scaly. No erythema.  Psychiatric: She has a normal mood and affect.  Filed Vitals:   01/13/13 1355  BP: 158/72  Pulse: 74  Temp: 98.4 F (36.9 C)  TempSrc: Tympanic  Resp: 20      Labs reviewed: Basic Metabolic Panel:  Recent Labs  16/10/96 0545 03/26/12 0600 03/27/12 0615  06/19/12 08/14/12 08/18/12 10/30/12 11/17/12  NA 145 145 144  --  145 142  --  140  --   K 4.3 3.4* 4.2  --  4.2 4.3  --  4.1  --   CL 103 101 102  --   --   --   --   --   --   CO2 27 33* 30  --   --   --   --   --   --   GLUCOSE 131* 109* 101*  --   --   --   --   --   --   BUN 71* 73* 79*  --  65* 49*  --  54*  --   CREATININE 1.87* 1.91* 1.97*  --  1.9* 1.7*  --  1.7*  --   CALCIUM 8.8 9.2 9.5  --   --   --   --   --   --   MG 2.2 2.2 2.2  --   --   --   --   --   --   TSH  --   --   --   < >  --  6.83* 3.84  --  3.29  < > = values in this interval not displayed. Liver Function Tests:  Recent Labs  08/14/12  AST 19  ALT 13  ALKPHOS 77   CBC:  Recent Labs  03/23/12 0057  03/23/12 0558 03/24/12 0944 03/26/12 0600 05/26/12 08/14/12 10/30/12  WBC 11.7*  --  11.1* 8.2 9.1 6.3 7.7 7.2  NEUTROABS 9.5*  --   --   --   --   --   --   --   HGB 10.5*  < > 10.7* 10.0* 11.5* 11.2* 10.8* 10.8*  HCT 33.7*  < > 34.0* 33.1* 36.6 34* 34* 33*  MCV 97.4  --  98.0 98.5 96.1  --   --   --   PLT 241  --  238 239 280 247 209 220  < > = values in this interval not displayed.   Past Procedures: 03/18/12 CXR no evidence of acute infiltrate or residual congestive heart failure is noted.    Assessment/Plan Pruritus Saw Dermatology: Benadryl prn as prior and TAC topical bid for 3-4 weeks.   Unspecified hypothyroidism Last TSH 5.383 05/26/12--increased Levothyroxine to  po daily, f/u TSH in 12 weeks-3.29 11/12/12          GERD (gastroesophageal reflux disease) Stable on Protonix 40mg  daily          Systolic CHF, acute on chronic Compensated with Furosemide 40mg  bid with prn Furosemide 40mg  for wt gain 2-3Ibs/24hr or 3-5Ib/week. BNP 638.9 08/14/12. Bun/creat 54/1.72 10/30/12          Unspecified constipation Stable on Senna S II bid.       HTN (hypertension) Controlled on Metoprolol 50mg  bid.               Family/ Staff Communication: observe the patient.  Dermatology done.   Goals of Care: SNF  Labs/tests ordered: none

## 2013-01-13 NOTE — Assessment & Plan Note (Signed)
Stable on Protonix 40 mg daily. 

## 2013-01-13 NOTE — Assessment & Plan Note (Signed)
Stable on Senna S II bid.          

## 2013-01-13 NOTE — Assessment & Plan Note (Signed)
Compensated with Furosemide 40mg  bid with prn Furosemide 40mg  for wt gain 2-3Ibs/24hr or 3-5Ib/week. BNP 638.9 08/14/12. Bun/creat 54/1.72 10/30/12

## 2013-03-03 ENCOUNTER — Non-Acute Institutional Stay (SKILLED_NURSING_FACILITY): Payer: Medicare Other | Admitting: Nurse Practitioner

## 2013-03-03 ENCOUNTER — Encounter: Payer: Self-pay | Admitting: Nurse Practitioner

## 2013-03-03 DIAGNOSIS — I10 Essential (primary) hypertension: Secondary | ICD-10-CM

## 2013-03-03 DIAGNOSIS — N184 Chronic kidney disease, stage 4 (severe): Secondary | ICD-10-CM

## 2013-03-03 DIAGNOSIS — I480 Paroxysmal atrial fibrillation: Secondary | ICD-10-CM

## 2013-03-03 DIAGNOSIS — K219 Gastro-esophageal reflux disease without esophagitis: Secondary | ICD-10-CM

## 2013-03-03 DIAGNOSIS — E039 Hypothyroidism, unspecified: Secondary | ICD-10-CM

## 2013-03-03 DIAGNOSIS — I4891 Unspecified atrial fibrillation: Secondary | ICD-10-CM

## 2013-03-03 DIAGNOSIS — I509 Heart failure, unspecified: Secondary | ICD-10-CM

## 2013-03-03 DIAGNOSIS — D649 Anemia, unspecified: Secondary | ICD-10-CM

## 2013-03-03 DIAGNOSIS — F3289 Other specified depressive episodes: Secondary | ICD-10-CM

## 2013-03-03 DIAGNOSIS — F329 Major depressive disorder, single episode, unspecified: Secondary | ICD-10-CM

## 2013-03-03 DIAGNOSIS — I5023 Acute on chronic systolic (congestive) heart failure: Secondary | ICD-10-CM

## 2013-03-03 DIAGNOSIS — K5900 Constipation, unspecified: Secondary | ICD-10-CM

## 2013-03-03 NOTE — Assessment & Plan Note (Signed)
Stable on Protonix 40 mg daily. 

## 2013-03-03 NOTE — Assessment & Plan Note (Signed)
Last TSH 5.383 05/26/12--increased Levothyroxine to 150mcg po daily, f/u TSH in 12 weeks-3.29 11/12/12             

## 2013-03-03 NOTE — Assessment & Plan Note (Signed)
Update renal function test.

## 2013-03-03 NOTE — Assessment & Plan Note (Signed)
Rate controlled 

## 2013-03-03 NOTE — Assessment & Plan Note (Addendum)
Compensated with Furosemide 40mg  bid with prn Furosemide 40mg  for wt gain 2-3Ibs/24hr or 3-5Ib/week. BNP 638.9 08/14/12. Bun/creat 54/1.72 10/30/12. Update CMP

## 2013-03-03 NOTE — Progress Notes (Signed)
Patient ID: [name], female   DOB: 03/13/1916, 79 y.o.   MRN: [id number]   Code Status: DNR  Allergies  Allergen Reactions  . Anaprox [Naproxen Sodium]     Unknown reaction    Chief Complaint  Patient presents with  . Medical Managment of Chronic Issues    HPI: Patient is a 79 y.o. female seen in the SNF at [facility] today for evaluation of her chronic medical conditions.  Problem List Items Addressed This Visit   PAF (paroxysmal atrial fibrillation) (Chronic)     Rate controlled.     HTN (hypertension) (Chronic)     Controlled on Metoprolol 50mg  bid.                 CKD (chronic kidney disease), stage IV (Chronic)     Update renal function test.     Depression (Chronic)     Stable w/o mood stabilizer.       Anemia (Chronic)     Update CBC    Unspecified hypothyroidism - Primary (Chronic)     Last TSH 5.383 05/26/12--increased Levothyroxine to po daily, f/u TSH in 12 weeks-3.29 11/12/12              Systolic CHF, acute on chronic     Compensated with Furosemide 40mg  bid with prn Furosemide 40mg  for wt gain 2-3Ibs/24hr or 3-5Ib/week. BNP 638.9 08/14/12. Bun/creat 54/1.72 10/30/12. Update CMP              GERD (gastroesophageal reflux disease)     Stable on Protonix 40mg  daily              Unspecified constipation     Stable on Senna S II bid.              Review of Systems:  Review of Systems  Constitutional: Negative for fever, chills, weight loss, malaise/fatigue and diaphoresis.  HENT: Positive for hearing loss. Negative for congestion and sore throat.   Eyes: Negative for pain, discharge and redness.  Respiratory: Positive for cough. Negative for sputum production, shortness of breath and wheezing.   Cardiovascular: Positive for leg swelling and PND. Negative for chest pain, palpitations, orthopnea and claudication.  Gastrointestinal: Negative for heartburn, nausea, vomiting, abdominal pain,  diarrhea, constipation, blood in stool and melena.  Genitourinary: Positive for frequency (incontinent of bladder). Negative for dysuria, urgency, hematuria and flank pain.  Musculoskeletal: Positive for back pain. Negative for falls, joint pain, myalgias and neck pain.  Skin: Positive for itching. Negative for rash.  Neurological: Negative for dizziness, tingling, tremors, sensory change, speech change, focal weakness, seizures, loss of consciousness, weakness and headaches.  Endo/Heme/Allergies: Negative for environmental allergies and polydipsia. Does not bruise/bleed easily.  Psychiatric/Behavioral: Positive for memory loss. Negative for depression and hallucinations. The patient is not nervous/anxious and does not have insomnia.      Past Medical History  Diagnosis Date  . Hypothyroidism   . Depression   . Migraines   . Dental pulp degeneration   . Corns and callus   . Gait disorder   . Urinary incontinence   . Skin cancer   . Cataract   . Skin cancer   . Insomnia    Past Surgical History  Procedure Laterality Date  . Total hip arthroplasty    . Cataract extraction    . Abdominal hysterectomy     Social History:   reports that she has never smoked. She has never used smokeless tobacco. She reports  that she does not drink alcohol or use illicit drugs.  Family History  Problem Relation Age of Onset  . Colon cancer Neg Hx     Medications: Patient's Medications  New Prescriptions   No medications on file  Previous Medications   ACETAMINOPHEN (TYLENOL) 500 MG TABLET    Take 1,000 mg by mouth every 6 (six) hours as needed. For pain. Bid   ALBUTEROL (PROVENTIL) (5 MG/ML) 0.5% NEBULIZER SOLUTION    Take 2.5 mg by nebulization every 4 (four) hours as needed. wheezing   ASPIRIN EC 81 MG TABLET    Take 81 mg by mouth daily.   BISACODYL (DULCOLAX) 5 MG EC TABLET    Take 5-10 mg by mouth daily as needed. For constipation   BRIMONIDINE-TIMOLOL (COMBIGAN) 0.2-0.5 % OPHTHALMIC  SOLUTION    Place 1 drop into both eyes every 12 (twelve) hours.   CHOLECALCIFEROL 1000 UNITS TABLET    Take 1,000 Units by mouth 2 (two) times daily.   CLOPIDOGREL (PLAVIX) 75 MG TABLET    Take 75 mg by mouth daily.     FUROSEMIDE (LASIX) 20 MG TABLET    Take 2 tablets (40 mg total) by mouth 2 (two) times daily.   HYDROXYZINE (ATARAX/VISTARIL) 25 MG TABLET    Take 12.5 mg by mouth every evening.   IPRATROPIUM-ALBUTEROL (DUONEB IN)    Inhale 1 ampule into the lungs every 6 (six) hours as needed. wheezing   LATANOPROST (XALATAN) 0.005 % OPHTHALMIC SOLUTION    Place 1 drop into both eyes at bedtime.   LEVOTHYROXINE (SYNTHROID, LEVOTHROID) 150 MCG TABLET    Take 150 mcg by mouth daily before breakfast.   METOPROLOL (LOPRESSOR) 50 MG TABLET    Take 50 mg by mouth 2 (two) times daily.   MULTIPLE VITAMINS-MINERALS (PRESERVISION AREDS 2 PO)    Take 1 capsule by mouth 2 (two) times daily.   NITROGLYCERIN (NITROSTAT) 0.4 MG SL TABLET    Place 0.4 mg under the tongue once.     PANTOPRAZOLE (PROTONIX) 40 MG TABLET    Take 40 mg by mouth daily.   POTASSIUM CHLORIDE SA (K-DUR,KLOR-CON) 20 MEQ TABLET    Take 1 tablet (20 mEq total) by mouth 2 (two) times daily.   SACCHAROMYCES BOULARDII (FLORASTOR) 250 MG CAPSULE    Take 250 mg by mouth 2 (two) times daily.   SENNA-DOCUSATE (SENOKOT-S) 8.6-50 MG PER TABLET    Take 1 tablet by mouth 2 (two) times daily.  Modified Medications   No medications on file  Discontinued Medications   No medications on file     Physical Exam: Physical Exam  Constitutional: She appears well-developed.  HENT:  Head: Normocephalic.  Nose: Nose normal.  Eyes: Conjunctivae and EOM are normal. Pupils are equal, round, and reactive to light.  Neck: Neck supple. No JVD present. No thyromegaly present.  Cardiovascular: Normal rate and normal heart sounds.   Pulmonary/Chest: Breath sounds normal.  Abdominal: Soft. Bowel sounds are normal.  Musculoskeletal: She exhibits no edema.    Lymphadenopathy:    She has no cervical adenopathy.  Neurological: She is alert. She has normal reflexes. No cranial nerve deficit.  Skin: Skin is warm and dry. No abrasion noted. Rash: dry and scaly. No erythema.  Psychiatric: She has a normal mood and affect.    Filed Vitals:   03/03/13 1414  BP: 141/61  Pulse: 75  Temp: 98 F (36.7 C)  TempSrc: Tympanic  Resp: 26      Labs reviewed:  Basic Metabolic Panel:  Recent Labs  21/30/86 0545 03/26/12 0600 03/27/12 0615  06/19/12 08/14/12 08/18/12 10/30/12 11/17/12  NA 145 145 144  --  145 142  --  140  --   K 4.3 3.4* 4.2  --  4.2 4.3  --  4.1  --   CL 103 101 102  --   --   --   --   --   --   CO2 27 33* 30  --   --   --   --   --   --   GLUCOSE 131* 109* 101*  --   --   --   --   --   --   BUN 71* 73* 79*  --  65* 49*  --  54*  --   CREATININE 1.87* 1.91* 1.97*  --  1.9* 1.7*  --  1.7*  --   CALCIUM 8.8 9.2 9.5  --   --   --   --   --   --   MG 2.2 2.2 2.2  --   --   --   --   --   --   TSH  --   --   --   < >  --  6.83* 3.84  --  3.29  < > = values in this interval not displayed. Liver Function Tests:  Recent Labs  08/14/12  AST 19  ALT 13  ALKPHOS 77   CBC:  Recent Labs  03/23/12 0057  03/23/12 0558 03/24/12 0944 03/26/12 0600 05/26/12 08/14/12 10/30/12  WBC 11.7*  --  11.1* 8.2 9.1 6.3 7.7 7.2  NEUTROABS 9.5*  --   --   --   --   --   --   --   HGB 10.5*  < > 10.7* 10.0* 11.5* 11.2* 10.8* 10.8*  HCT 33.7*  < > 34.0* 33.1* 36.6 34* 34* 33*  MCV 97.4  --  98.0 98.5 96.1  --   --   --   PLT 241  --  238 239 280 247 209 220  < > = values in this interval not displayed.  Assessment/Plan Unspecified hypothyroidism Last TSH 5.383 05/26/12--increased Levothyroxine to po daily, f/u TSH in 12 weeks-3.29 11/12/12            GERD (gastroesophageal reflux disease) Stable on Protonix 40mg  daily            Systolic CHF, acute on chronic Compensated with Furosemide 40mg  bid with prn  Furosemide 40mg  for wt gain 2-3Ibs/24hr or 3-5Ib/week. BNP 638.9 08/14/12. Bun/creat 54/1.72 10/30/12. Update CMP            Unspecified constipation Stable on Senna S II bid.         HTN (hypertension) Controlled on Metoprolol 50mg  bid.               Depression Stable w/o mood stabilizer.     Anemia Update CBC  CKD (chronic kidney disease), stage IV Update renal function test.   PAF (paroxysmal atrial fibrillation) Rate controlled.     Family/ Staff Communication: observe the patient.   Goals of Care: SNF  Labs/tests ordered: none

## 2013-03-03 NOTE — Assessment & Plan Note (Signed)
Update CBC. 

## 2013-03-03 NOTE — Assessment & Plan Note (Signed)
Controlled on Metoprolol 50mg bid.                

## 2013-03-03 NOTE — Assessment & Plan Note (Signed)
Stable w/o mood stabilizer.  

## 2013-03-03 NOTE — Assessment & Plan Note (Signed)
Stable on Senna S II bid.          

## 2013-03-04 LAB — BASIC METABOLIC PANEL
BUN: 43 mg/dL — AB (ref 4–21)
Creatinine: 1.7 mg/dL — AB (ref 0.5–1.1)
GLUCOSE: 156 mg/dL
POTASSIUM: 4.2 mmol/L (ref 3.4–5.3)
Sodium: 138 mmol/L (ref 137–147)

## 2013-03-04 LAB — CBC AND DIFFERENTIAL
HCT: 34 % — AB (ref 36–46)
HEMOGLOBIN: 11.3 g/dL — AB (ref 12.0–16.0)
Platelets: 256 10*3/uL (ref 150–399)
WBC: 7.1 10*3/mL

## 2013-03-04 LAB — HEPATIC FUNCTION PANEL
ALT: 9 U/L (ref 7–35)
AST: 16 U/L (ref 13–35)
Alkaline Phosphatase: 69 U/L (ref 25–125)
Bilirubin, Total: 0.4 mg/dL

## 2013-03-31 ENCOUNTER — Non-Acute Institutional Stay (SKILLED_NURSING_FACILITY): Payer: Medicare Other

## 2013-03-31 DIAGNOSIS — E039 Hypothyroidism, unspecified: Secondary | ICD-10-CM

## 2013-03-31 DIAGNOSIS — I10 Essential (primary) hypertension: Secondary | ICD-10-CM

## 2013-03-31 DIAGNOSIS — K219 Gastro-esophageal reflux disease without esophagitis: Secondary | ICD-10-CM

## 2013-03-31 DIAGNOSIS — K5900 Constipation, unspecified: Secondary | ICD-10-CM

## 2013-03-31 DIAGNOSIS — I480 Paroxysmal atrial fibrillation: Secondary | ICD-10-CM

## 2013-03-31 DIAGNOSIS — I5020 Unspecified systolic (congestive) heart failure: Secondary | ICD-10-CM

## 2013-03-31 DIAGNOSIS — Z299 Encounter for prophylactic measures, unspecified: Secondary | ICD-10-CM

## 2013-03-31 DIAGNOSIS — I4891 Unspecified atrial fibrillation: Secondary | ICD-10-CM

## 2013-03-31 NOTE — Progress Notes (Signed)
Patient ID: [name], female   DOB: 1917-02-15, 80 y.o.   MRN: [id number]   Code Status: DNR  Allergies  Allergen Reactions  . Anaprox [Naproxen Sodium]     Unknown reaction  . Hydrocodone     Unknown reaction  . Vicodin [Hydrocodone-Acetaminophen]     Chief Complaint  Patient presents with  . Medical Managment of Chronic Issues    dementia, CHF, blood pressure  . Altered Mental Status    urine pending    HPI: Patient is a 80 y.o. female seen in the SNF at [facility] today for evaluation of her chronic medical conditions.  Problem List Items Addressed This Visit   GERD (gastroesophageal reflux disease)     Stable on Protonix 40mg  daily                HTN (hypertension) (Chronic)     Controlled on Metoprolol 50mg  bid.                   Relevant Medications      furosemide (LASIX) 20 MG tablet   PAF (paroxysmal atrial fibrillation) (Chronic)     Rate controlled.       Systolic CHF (Chronic)     Compensated with Furosemide 40mg  bid with prn Furosemide 40mg  for wt gain 2-3Ibs/24hr or 3-5Ib/week. BNP 638.9 08/14/12. Bun/creat 54/1.72 10/30/12 and 43/1.71 03/04/13                Unspecified constipation     Stable on Senna S II bid.             Unspecified hypothyroidism (Chronic)     Last TSH 5.383 05/26/12--increased Levothyroxine to 184mcg po daily, f/u TSH in 12 weeks-3.29 11/12/12                 Other Visit Diagnoses   Preventive measure    -  Primary    Relevant Orders       DNR (Do Not Resuscitate)       Review of Systems:  Review of Systems  Constitutional: Negative for fever, chills, weight loss, malaise/fatigue and diaphoresis.  HENT: Positive for hearing loss. Negative for congestion and sore throat.   Eyes: Negative for pain, discharge and redness.  Respiratory: Positive for cough. Negative for sputum production, shortness of breath and wheezing.   Cardiovascular: Positive for leg swelling  and PND. Negative for chest pain, palpitations, orthopnea and claudication.  Gastrointestinal: Negative for heartburn, nausea, vomiting, abdominal pain, diarrhea, constipation, blood in stool and melena.  Genitourinary: Positive for frequency (incontinent of bladder). Negative for dysuria, urgency, hematuria and flank pain.  Musculoskeletal: Positive for back pain. Negative for falls, joint pain, myalgias and neck pain.  Skin: Positive for itching. Negative for rash.  Neurological: Negative for dizziness, tingling, tremors, sensory change, speech change, focal weakness, seizures, loss of consciousness, weakness and headaches.  Endo/Heme/Allergies: Negative for environmental allergies and polydipsia. Does not bruise/bleed easily.  Psychiatric/Behavioral: Positive for memory loss. Negative for depression and hallucinations. The patient is not nervous/anxious and does not have insomnia.      Past Medical History  Diagnosis Date  . Hypothyroidism   . Depression   . Migraines   . Dental pulp degeneration   . Corns and callus   . Gait disorder   . Urinary incontinence   . Skin cancer   . Cataract   . Skin cancer   . Insomnia   . Anemia, unspecified   . Unspecified  essential hypertension   . Coronary atherosclerosis of native coronary artery   . Congestive heart failure, unspecified   . Reflux esophagitis   . Unspecified constipation   . Senile osteoporosis   . Pathologic fracture of vertebrae   . Other abnormal blood chemistry   . Hypoxemia   . Unspecified disorder of kidney and ureter   . Altered mental status   . Acute and chronic respiratory failure   . Retinal vascular occlusion, unspecified   . Cardiomyopathy in other diseases classified elsewhere   . Atrial fibrillation   . Chronic kidney disease, stage V   . Osteoarthrosis, unspecified whether generalized or localized, pelvic region and thigh   . Dysphagia, unspecified(787.20)   . Hip joint replacement by other means   .  Open-angle glaucoma, unspecified(365.10)    Past Surgical History  Procedure Laterality Date  . Total hip arthroplasty Right 2002  . Cataract extraction Bilateral   . Abdominal hysterectomy    . Skin cancer excision  [place]  . Proximal radial fracture repair Left 2008   Social History:   reports that she has never smoked. She has never used smokeless tobacco. She reports that she does not drink alcohol or use illicit drugs.  Family History  Problem Relation Age of Onset  . Colon cancer Neg Hx   . Heart disease Mother   . Heart disease Father   . Heart disease Brother   . Heart disease Brother     Medications: Patient's Medications  New Prescriptions   No medications on file  Previous Medications   ACETAMINOPHEN (TYLENOL) 500 MG TABLET    Take 1,000 mg by mouth every 6 (six) hours as needed. For pain. Take 2 tablets twice daily   ALBUTEROL (PROVENTIL) (5 MG/ML) 0.5% NEBULIZER SOLUTION    Take 2.5 mg by nebulization every 4 (four) hours as needed. wheezing   AMOXICILLIN (AMOXIL) 500 MG TABLET    Take 500 mg by mouth. Take 4 capsules prior to dental procedure as directed by access dental care staff   ASPIRIN EC 81 MG TABLET    Take 81 mg by mouth daily.   BISACODYL (DULCOLAX) 5 MG EC TABLET    Take 5-10 mg by mouth daily as needed. For constipation   BRIMONIDINE-TIMOLOL (COMBIGAN) 0.2-0.5 % OPHTHALMIC SOLUTION    Place 1 drop into both eyes every 12 (twelve) hours.   BRINZOLAMIDE (AZOPT) 1 % OPHTHALMIC SUSPENSION    Place 1 drop into the right eye. Twice daily   CHOLECALCIFEROL 1000 UNITS TABLET    Take 1,000 Units by mouth 2 (two) times daily.   CLOPIDOGREL (PLAVIX) 75 MG TABLET    Take 75 mg by mouth daily.     GUAIFENESIN (MUCINEX) 600 MG 12 HR TABLET    Take 600 mg by mouth. Take 2 tablets twice ddaily as needed for congestion   HYDROXYZINE (ATARAX/VISTARIL) 25 MG TABLET    Take 12.5 mg by mouth every evening.   IPRATROPIUM-ALBUTEROL (DUONEB IN)    Inhale 1 ampule into the  lungs every 6 (six) hours as needed. wheezing   LATANOPROST (XALATAN) 0.005 % OPHTHALMIC SOLUTION    Place 1 drop into both eyes at bedtime.   LEVOTHYROXINE (SYNTHROID, LEVOTHROID) 150 MCG TABLET    Take 150 mcg by mouth daily before breakfast.   METOPROLOL (LOPRESSOR) 50 MG TABLET    Take 50 mg by mouth 2 (two) times daily.   MULTIPLE VITAMINS-MINERALS (PRESERVISION AREDS 2 PO)    Take  1 capsule by mouth 2 (two) times daily.   NITROGLYCERIN (NITROSTAT) 0.4 MG SL TABLET    Place 0.4 mg under the tongue once.     PANTOPRAZOLE (PROTONIX) 40 MG TABLET    Take 40 mg by mouth daily.   POLYETHYL GLYCOL-PROPYL GLYCOL (SYSTANE) 0.4-0.3 % SOLN    Apply to eye. One drop in both eyes 4 times daily   POTASSIUM CHLORIDE SA (K-DUR,KLOR-CON) 20 MEQ TABLET    Take 1 tablet (20 mEq total) by mouth 2 (two) times daily.   SENNA-DOCUSATE (SENOKOT-S) 8.6-50 MG PER TABLET    Take 2 tablets by mouth 2 (two) times daily.   Modified Medications   Modified Medication Previous Medication   FUROSEMIDE (LASIX) 20 MG TABLET furosemide (LASIX) 20 MG tablet      Take 40 mg by mouth 2 (two) times daily. Take 40mg  tablet as needed for weight gain of 2-3 lbs/24hours or 3-5 lbs/week    Take 2 tablets (40 mg total) by mouth 2 (two) times daily.  Discontinued Medications   SACCHAROMYCES BOULARDII (FLORASTOR) 250 MG CAPSULE    Take 250 mg by mouth 2 (two) times daily.     Physical Exam: Physical Exam  Constitutional: She appears well-developed.  HENT:  Head: Normocephalic.  Nose: Nose normal.  Eyes: Conjunctivae and EOM are normal. Pupils are equal, round, and reactive to light.  Neck: Neck supple. No JVD present. No thyromegaly present.  Cardiovascular: Normal rate and normal heart sounds.   Pulmonary/Chest: Breath sounds normal.  Abdominal: Soft. Bowel sounds are normal.  Musculoskeletal: She exhibits no edema.  Lymphadenopathy:    She has no cervical adenopathy.  Neurological: She is alert. She has normal reflexes. No  cranial nerve deficit.  Skin: Skin is warm and dry. No abrasion noted. Rash: dry and scaly. No erythema.  Psychiatric: She has a normal mood and affect.    Filed Vitals:   03/31/13 1049  BP: 128/80  Pulse: 66  Temp: 98 F (36.7 C)  Resp: 20  SpO2: 96%      Labs reviewed: Basic Metabolic Panel:  Recent Labs  08/14/12 08/18/12 10/30/12 11/17/12 03/04/13  NA 142  --  140  --  138  K 4.3  --  4.1  --  4.2  BUN 49*  --  54*  --  43*  CREATININE 1.7*  --  1.7*  --  1.7*  TSH 6.83* 3.84  --  3.29  --    Liver Function Tests:  Recent Labs  08/14/12 03/04/13  AST 19 16  ALT 13 9  ALKPHOS 77 69   CBC:  Recent Labs  08/14/12 10/30/12 03/04/13  WBC 7.7 7.2 7.1  HGB 10.8* 10.8* 11.3*  HCT 34* 33* 34*  PLT 209 220 256    Assessment/Plan Unspecified hypothyroidism Last TSH 5.383 05/26/12--increased Levothyroxine to 175mcg po daily, f/u TSH in 12 weeks-3.29 11/12/12              GERD (gastroesophageal reflux disease) Stable on Protonix 40mg  daily              HTN (hypertension) Controlled on Metoprolol 50mg  bid.                 Systolic CHF Compensated with Furosemide 40mg  bid with prn Furosemide 40mg  for wt gain 2-3Ibs/24hr or 3-5Ib/week. BNP 638.9 08/14/12. Bun/creat 54/1.72 10/30/12 and 43/1.71 03/04/13              PAF (paroxysmal atrial fibrillation) Rate controlled.  Unspecified constipation Stable on Senna S II bid.             Family/ Staff Communication: observe the patient.   Goals of Care: SNF  Labs/tests ordered: none

## 2013-03-31 NOTE — Assessment & Plan Note (Signed)
Compensated with Furosemide 40mg  bid with prn Furosemide 40mg  for wt gain 2-3Ibs/24hr or 3-5Ib/week. BNP 638.9 08/14/12. Bun/creat 54/1.72 10/30/12 and 43/1.71 03/04/13

## 2013-03-31 NOTE — Assessment & Plan Note (Signed)
Controlled on Metoprolol 50mg  bid.

## 2013-03-31 NOTE — Assessment & Plan Note (Signed)
Stable on Protonix 40 mg daily. 

## 2013-03-31 NOTE — Assessment & Plan Note (Signed)
Last TSH 5.383 05/26/12--increased Levothyroxine to 142mcg po daily, f/u TSH in 12 weeks-3.29 11/12/12

## 2013-03-31 NOTE — Assessment & Plan Note (Signed)
Rate controlled 

## 2013-03-31 NOTE — Assessment & Plan Note (Signed)
Stable on Senna S II bid.

## 2013-04-01 ENCOUNTER — Encounter (HOSPITAL_COMMUNITY): Payer: Self-pay | Admitting: Emergency Medicine

## 2013-04-01 ENCOUNTER — Emergency Department (HOSPITAL_COMMUNITY): Payer: Medicare Other

## 2013-04-01 ENCOUNTER — Emergency Department (HOSPITAL_COMMUNITY)
Admission: EM | Admit: 2013-04-01 | Discharge: 2013-04-01 | Disposition: A | Discharge status: Home / Self Care | Payer: Medicare Other | Attending: Emergency Medicine | Admitting: Emergency Medicine

## 2013-04-01 DIAGNOSIS — Z7901 Long term (current) use of anticoagulants: Secondary | ICD-10-CM | POA: Insufficient documentation

## 2013-04-01 DIAGNOSIS — F3289 Other specified depressive episodes: Secondary | ICD-10-CM | POA: Insufficient documentation

## 2013-04-01 DIAGNOSIS — R112 Nausea with vomiting, unspecified: Secondary | ICD-10-CM | POA: Insufficient documentation

## 2013-04-01 DIAGNOSIS — E039 Hypothyroidism, unspecified: Secondary | ICD-10-CM | POA: Insufficient documentation

## 2013-04-01 DIAGNOSIS — I2510 Atherosclerotic heart disease of native coronary artery without angina pectoris: Secondary | ICD-10-CM | POA: Insufficient documentation

## 2013-04-01 DIAGNOSIS — R0902 Hypoxemia: Secondary | ICD-10-CM

## 2013-04-01 DIAGNOSIS — K219 Gastro-esophageal reflux disease without esophagitis: Secondary | ICD-10-CM | POA: Insufficient documentation

## 2013-04-01 DIAGNOSIS — M810 Age-related osteoporosis without current pathological fracture: Secondary | ICD-10-CM | POA: Insufficient documentation

## 2013-04-01 DIAGNOSIS — I509 Heart failure, unspecified: Secondary | ICD-10-CM | POA: Insufficient documentation

## 2013-04-01 DIAGNOSIS — N189 Chronic kidney disease, unspecified: Secondary | ICD-10-CM | POA: Insufficient documentation

## 2013-04-01 DIAGNOSIS — Z7982 Long term (current) use of aspirin: Secondary | ICD-10-CM | POA: Insufficient documentation

## 2013-04-01 DIAGNOSIS — J690 Pneumonitis due to inhalation of food and vomit: Secondary | ICD-10-CM

## 2013-04-01 DIAGNOSIS — Z96649 Presence of unspecified artificial hip joint: Secondary | ICD-10-CM | POA: Insufficient documentation

## 2013-04-01 DIAGNOSIS — J698 Pneumonitis due to inhalation of other solids and liquids: Secondary | ICD-10-CM | POA: Insufficient documentation

## 2013-04-01 DIAGNOSIS — Z66 Do not resuscitate: Secondary | ICD-10-CM | POA: Insufficient documentation

## 2013-04-01 DIAGNOSIS — R197 Diarrhea, unspecified: Secondary | ICD-10-CM | POA: Insufficient documentation

## 2013-04-01 DIAGNOSIS — F329 Major depressive disorder, single episode, unspecified: Secondary | ICD-10-CM | POA: Insufficient documentation

## 2013-04-01 DIAGNOSIS — Z79899 Other long term (current) drug therapy: Secondary | ICD-10-CM | POA: Insufficient documentation

## 2013-04-01 DIAGNOSIS — I129 Hypertensive chronic kidney disease with stage 1 through stage 4 chronic kidney disease, or unspecified chronic kidney disease: Secondary | ICD-10-CM | POA: Insufficient documentation

## 2013-04-01 DIAGNOSIS — H4010X0 Unspecified open-angle glaucoma, stage unspecified: Secondary | ICD-10-CM | POA: Insufficient documentation

## 2013-04-01 LAB — CBC WITH DIFFERENTIAL/PLATELET
Basophils Absolute: 0 10*3/uL (ref 0.0–0.1)
Basophils Relative: 0 % (ref 0–1)
Eosinophils Absolute: 0 10*3/uL (ref 0.0–0.7)
Eosinophils Relative: 0 % (ref 0–5)
HEMATOCRIT: 42.3 % (ref 36.0–46.0)
Hemoglobin: 13.6 g/dL (ref 12.0–15.0)
LYMPHS ABS: 1 10*3/uL (ref 0.7–4.0)
LYMPHS PCT: 8 % — AB (ref 12–46)
MCH: 31.3 pg (ref 26.0–34.0)
MCHC: 32.2 g/dL (ref 30.0–36.0)
MCV: 97.2 fL (ref 78.0–100.0)
MONO ABS: 1.1 10*3/uL — AB (ref 0.1–1.0)
Monocytes Relative: 8 % (ref 3–12)
NEUTROS ABS: 10.9 10*3/uL — AB (ref 1.7–7.7)
Neutrophils Relative %: 84 % — ABNORMAL HIGH (ref 43–77)
Platelets: 236 10*3/uL (ref 150–400)
RBC: 4.35 MIL/uL (ref 3.87–5.11)
RDW: 15.9 % — ABNORMAL HIGH (ref 11.5–15.5)
WBC: 13 10*3/uL — AB (ref 4.0–10.5)

## 2013-04-01 LAB — BASIC METABOLIC PANEL
BUN: 62 mg/dL — AB (ref 6–23)
CHLORIDE: 103 meq/L (ref 96–112)
CO2: 23 meq/L (ref 19–32)
Calcium: 9.1 mg/dL (ref 8.4–10.5)
Creatinine, Ser: 1.73 mg/dL — ABNORMAL HIGH (ref 0.50–1.10)
GFR calc Af Amer: 28 mL/min — ABNORMAL LOW (ref >90)
GFR calc non Af Amer: 24 mL/min — ABNORMAL LOW (ref >90)
Glucose, Bld: 140 mg/dL — ABNORMAL HIGH (ref 70–99)
Potassium: 4.2 mEq/L (ref 3.7–5.3)
Sodium: 145 mEq/L (ref 137–147)

## 2013-04-01 MED ORDER — ONDANSETRON 8 MG PO TBDP: 8.0000 mg | ORAL_TABLET | Freq: Three times a day (TID) | ORAL | Status: AC | PRN

## 2013-04-01 MED ORDER — LEVOFLOXACIN 500 MG PO TABS: 500.0000 mg | ORAL_TABLET | Freq: Every day | ORAL | Status: AC

## 2013-04-01 MED ORDER — LEVOFLOXACIN 500 MG PO TABS
500.0000 mg | ORAL_TABLET | Freq: Once | ORAL | Status: AC
  Administered 2013-04-01: 500 mg via ORAL
  Filled 2013-04-01: qty 1

## 2013-04-01 MED ORDER — ONDANSETRON 8 MG PO TBDP
8.0000 mg | ORAL_TABLET | Freq: Once | ORAL | Status: AC
Start: 2013-04-01 — End: 2013-04-01
  Administered 2013-04-01: 8 mg via ORAL
  Filled 2013-04-01: qty 1

## 2013-04-01 NOTE — ED Notes (Signed)
Per EMS pt has had stomach virus pt was vomiting w/ diarrhea. Given tylenol rectally at 1245 am pt was found with O2 sats of 76% with dry vomit around mouth. Pt has hx of respiratory failure. Pt is DNR and has MOST form. Pt is 96% on 15L.

## 2013-04-01 NOTE — ED Notes (Signed)
Bed: RESB Expected date: 04/01/13 Expected time: 1:53 AM Means of arrival: Ambulance Comments: Vomiting/dementia

## 2013-04-01 NOTE — Discharge Instructions (Signed)
The patient will be prescribed Zofran for nausea.  Her oxygen will need to be increased to 4 L, and this can be titrated up to 6 L as needed for comfort and to keep O2 sats greater than 90%.  Patient be started on antibiotics for possible aspiration.

## 2013-04-01 NOTE — ED Provider Notes (Signed)
CSN: [id number]     Arrival date & time 04/01/13  0210 History   First MD Initiated Contact with Patient 04/01/13 0211     Chief Complaint  Patient presents with  . Respiratory Distress    HPI Level V caveat: Dementia  Patient presents emergency department today after reported possible aspiration.  EMS was called out because the patient has had some nausea vomiting and diarrhea.  Nursing staff went to check on the patient and she was noted to have vomited in her mouth.  EMS report that her oxygen saturation was 69% on arrival.  The patient presents with a MOST form stating comfort measures and antibiotics as needed.  No IV requested by the patient and patient's family per the form.  Patient is a DO NOT RESUSCITATE.  Family arrived shortly after we had a long discussion about the patient.  They agree with the IV fluids and IV maintenance.  They're agreeable to chest x-ray and antibiotics as needed.  O2 sats on room air in the emergency department or 81%.  She is on 2 L nasal cannula at home.  On 4 L nasal cannula her O2 sats are 91% and her breathing is much better   Past Medical History  Diagnosis Date  . Hypothyroidism   . Depression   . Migraines   . Dental pulp degeneration   . Corns and callus   . Gait disorder   . Urinary incontinence   . Skin cancer   . Cataract   . Skin cancer   . Insomnia   . Anemia, unspecified   . Unspecified essential hypertension   . Coronary atherosclerosis of native coronary artery   . Congestive heart failure, unspecified   . Reflux esophagitis   . Unspecified constipation   . Senile osteoporosis   . Pathologic fracture of vertebrae   . Other abnormal blood chemistry   . Hypoxemia   . Unspecified disorder of kidney and ureter   . Altered mental status   . Acute and chronic respiratory failure   . Retinal vascular occlusion, unspecified   . Cardiomyopathy in other diseases classified elsewhere   . Atrial fibrillation   . Chronic kidney disease,  stage V   . Osteoarthrosis, unspecified whether generalized or localized, pelvic region and thigh   . Dysphagia, unspecified(787.20)   . Hip joint replacement by other means   . Open-angle glaucoma, unspecified(365.10)    Past Surgical History  Procedure Laterality Date  . Total hip arthroplasty Right 2002  . Cataract extraction Bilateral   . Abdominal hysterectomy    . Skin cancer excision  [place]  . Proximal radial fracture repair Left 2008   Family History  Problem Relation Age of Onset  . Colon cancer Neg Hx   . Heart disease Mother   . Heart disease Father   . Heart disease Brother   . Heart disease Brother    History  Substance Use Topics  . Smoking status: Never Smoker   . Smokeless tobacco: Never Used  . Alcohol Use: No   OB History   Grav Para Term Preterm Abortions TAB SAB Ect Mult Living                 Review of Systems  Unable to perform ROS   Allergies  Anaprox; Hydrocodone; and Vicodin  Home Medications   Current Outpatient Rx  Name  Route  Sig  Dispense  Refill  . acetaminophen (TYLENOL) 325 MG tablet  Oral   Take 650 mg by mouth 2 (two) times daily.         Marland Kitchen amoxicillin (AMOXIL) 500 MG tablet   Oral   Take 500 mg by mouth. Take 4 capsules prior to dental procedure as directed by access dental care staff         . aspirin EC 81 MG tablet   Oral   Take 81 mg by mouth daily.         . bisacodyl (DULCOLAX) 5 MG EC tablet   Oral   Take 5-10 mg by mouth daily as needed. For constipation         . brimonidine-timolol (COMBIGAN) 0.2-0.5 % ophthalmic solution   Both Eyes   Place 1 drop into both eyes every 12 (twelve) hours.         . brinzolamide (AZOPT) 1 % ophthalmic suspension   Right Eye   Place 1 drop into the right eye. Twice daily         . Cholecalciferol 1000 UNITS tablet   Oral   Take 1,000 Units by mouth 2 (two) times daily.         . clopidogrel (PLAVIX) 75 MG tablet   Oral   Take 75 mg by mouth daily.            . furosemide (LASIX) 40 MG tablet   Oral   Take 40 mg by mouth 2 (two) times daily.         Marland Kitchen guaiFENesin (MUCINEX) 600 MG 12 hr tablet   Oral   Take 600 mg by mouth. Take 2 tablets twice ddaily as needed for congestion         . hydrOXYzine (ATARAX/VISTARIL) 25 MG tablet   Oral   Take 12.5 mg by mouth every evening.         . Ipratropium-Albuterol (DUONEB IN)   Inhalation   Inhale 1 ampule into the lungs every 6 (six) hours as needed. wheezing         . latanoprost (XALATAN) 0.005 % ophthalmic solution   Both Eyes   Place 1 drop into both eyes at bedtime.         Marland Kitchen levothyroxine (SYNTHROID, LEVOTHROID) 150 MCG tablet   Oral   Take 150 mcg by mouth daily before breakfast.         . loperamide (IMODIUM) 2 MG capsule   Oral   Take 2-4 mg by mouth daily as needed for diarrhea or loose stools.         . metoprolol (LOPRESSOR) 50 MG tablet   Oral   Take 50 mg by mouth 2 (two) times daily.         . Multiple Vitamins-Minerals (PRESERVISION AREDS 2 PO)   Oral   Take 1 capsule by mouth 2 (two) times daily.         . nitroGLYCERIN (NITROSTAT) 0.4 MG SL tablet   Sublingual   Place 0.4 mg under the tongue once.           . pantoprazole (PROTONIX) 40 MG tablet   Oral   Take 40 mg by mouth daily.         Vladimir Faster Glycol-Propyl Glycol (SYSTANE) 0.4-0.3 % SOLN   Ophthalmic   Apply 1 drop to eye 4 (four) times daily - after meals and at bedtime.          . potassium chloride (K-DUR) 10 MEQ tablet   Oral   Take 20 mEq  by mouth daily.         . promethazine (PHENERGAN) 12.5 MG tablet   Oral   Take 12.5 mg by mouth every 4 (four) hours as needed for nausea or vomiting. x48 hours         . senna-docusate (SENOKOT-S) 8.6-50 MG per tablet   Oral   Take 2 tablets by mouth 2 (two) times daily.          Marland Kitchen albuterol (PROVENTIL) (5 MG/ML) 0.5% nebulizer solution   Nebulization   Take 2.5 mg by nebulization every 4 (four) hours as  needed. wheezing         . levofloxacin (LEVAQUIN) 500 MG tablet   Oral   Take 1 tablet (500 mg total) by mouth daily.   7 tablet   0   . ondansetron (ZOFRAN ODT) 8 MG disintegrating tablet   Oral   Take 1 tablet (8 mg total) by mouth every 8 (eight) hours as needed for nausea or vomiting.   10 tablet   0    BP 116/49  Pulse 118  SpO2 97% Physical Exam  Nursing note and vitals reviewed. Constitutional: She appears well-developed and well-nourished. No distress.  HENT:  Head: Normocephalic and atraumatic.  Eyes: EOM are normal.  Neck: Normal range of motion.  Cardiovascular: Normal rate, regular rhythm and normal heart sounds.   Pulmonary/Chest: Effort normal and breath sounds normal.  Abdominal: Soft. She exhibits no distension.  Musculoskeletal: Normal range of motion.  Neurological: She is alert.  Skin: Skin is warm and dry.  Psychiatric: She has a normal mood and affect. Judgment normal.    ED Course  Procedures (including critical care time) Labs Review Labs Reviewed  CBC WITH DIFFERENTIAL - Abnormal; Notable for the following:    WBC 13.0 (*)    RDW 15.9 (*)    Neutrophils Relative % 84 (*)    Neutro Abs 10.9 (*)    Lymphocytes Relative 8 (*)    Monocytes Absolute 1.1 (*)    All other components within normal limits  BASIC METABOLIC PANEL - Abnormal; Notable for the following:    Glucose, Bld 140 (*)    BUN 62 (*)    Creatinine, Ser 1.73 (*)    GFR calc non Af Amer 24 (*)    GFR calc Af Amer 28 (*)    All other components within normal limits   Imaging Review Dg Chest 2 View  04/01/2013   CLINICAL DATA:  Respiratory distress.  EXAM: CHEST  2 VIEW  COMPARISON:  Chest radiograph March 26, 2012  FINDINGS: Cardiac silhouette remains moderately enlarged, moderately calcified aortic knob. Similar interstitial prominence, with decreased perihilar airspace opacities, improved aeration of left lung base. Similarly elevated right hemidiaphragm. No pleural  effusions. Biapical pleural thickening. No pneumothorax.  Osteopenia.  Soft tissue planes are nonsuspicious.  IMPRESSION: Stable cardiomegaly and interstitial prominence with decreased pulmonary edema.   Electronically Signed   By: Elon Alas   On: 04/01/2013 03:02  I personally reviewed the imaging tests through PACS system I reviewed available ER/hospitalization records through the EMR   EKG Interpretation   None       MDM   1. Hypoxia   2. Aspiration pneumonia   3. Nausea vomiting and diarrhea    Renal insufficiency is slightly worse.  Patient's O2 sats on 4 L nasal cannula 91%.  She resides at the nursing facility.  She can receive oxygen antibiotics there.  I long discussion with the  family as the patient has advanced dementia and has advanced directives in the form of the MOST form which clearly states the patient and the patient's healthcare power of attorney and the patient's physicians wishes.  I went through this with the patient's son at length and we were both very clear as to what the most form stated and what the son's wishes were    Hoy Morn, MD 04/01/13 [phone]

## 2013-04-12 DEATH — deceased

## 2014-05-07 IMAGING — CR DG CHEST 2V
2 series · 2 of 2 positions shown · non-contrast
Comparison: Chest radiograph March 26, 2012

CLINICAL DATA: Respiratory distress.

EXAM:
CHEST  2 VIEW

[w chest lat]
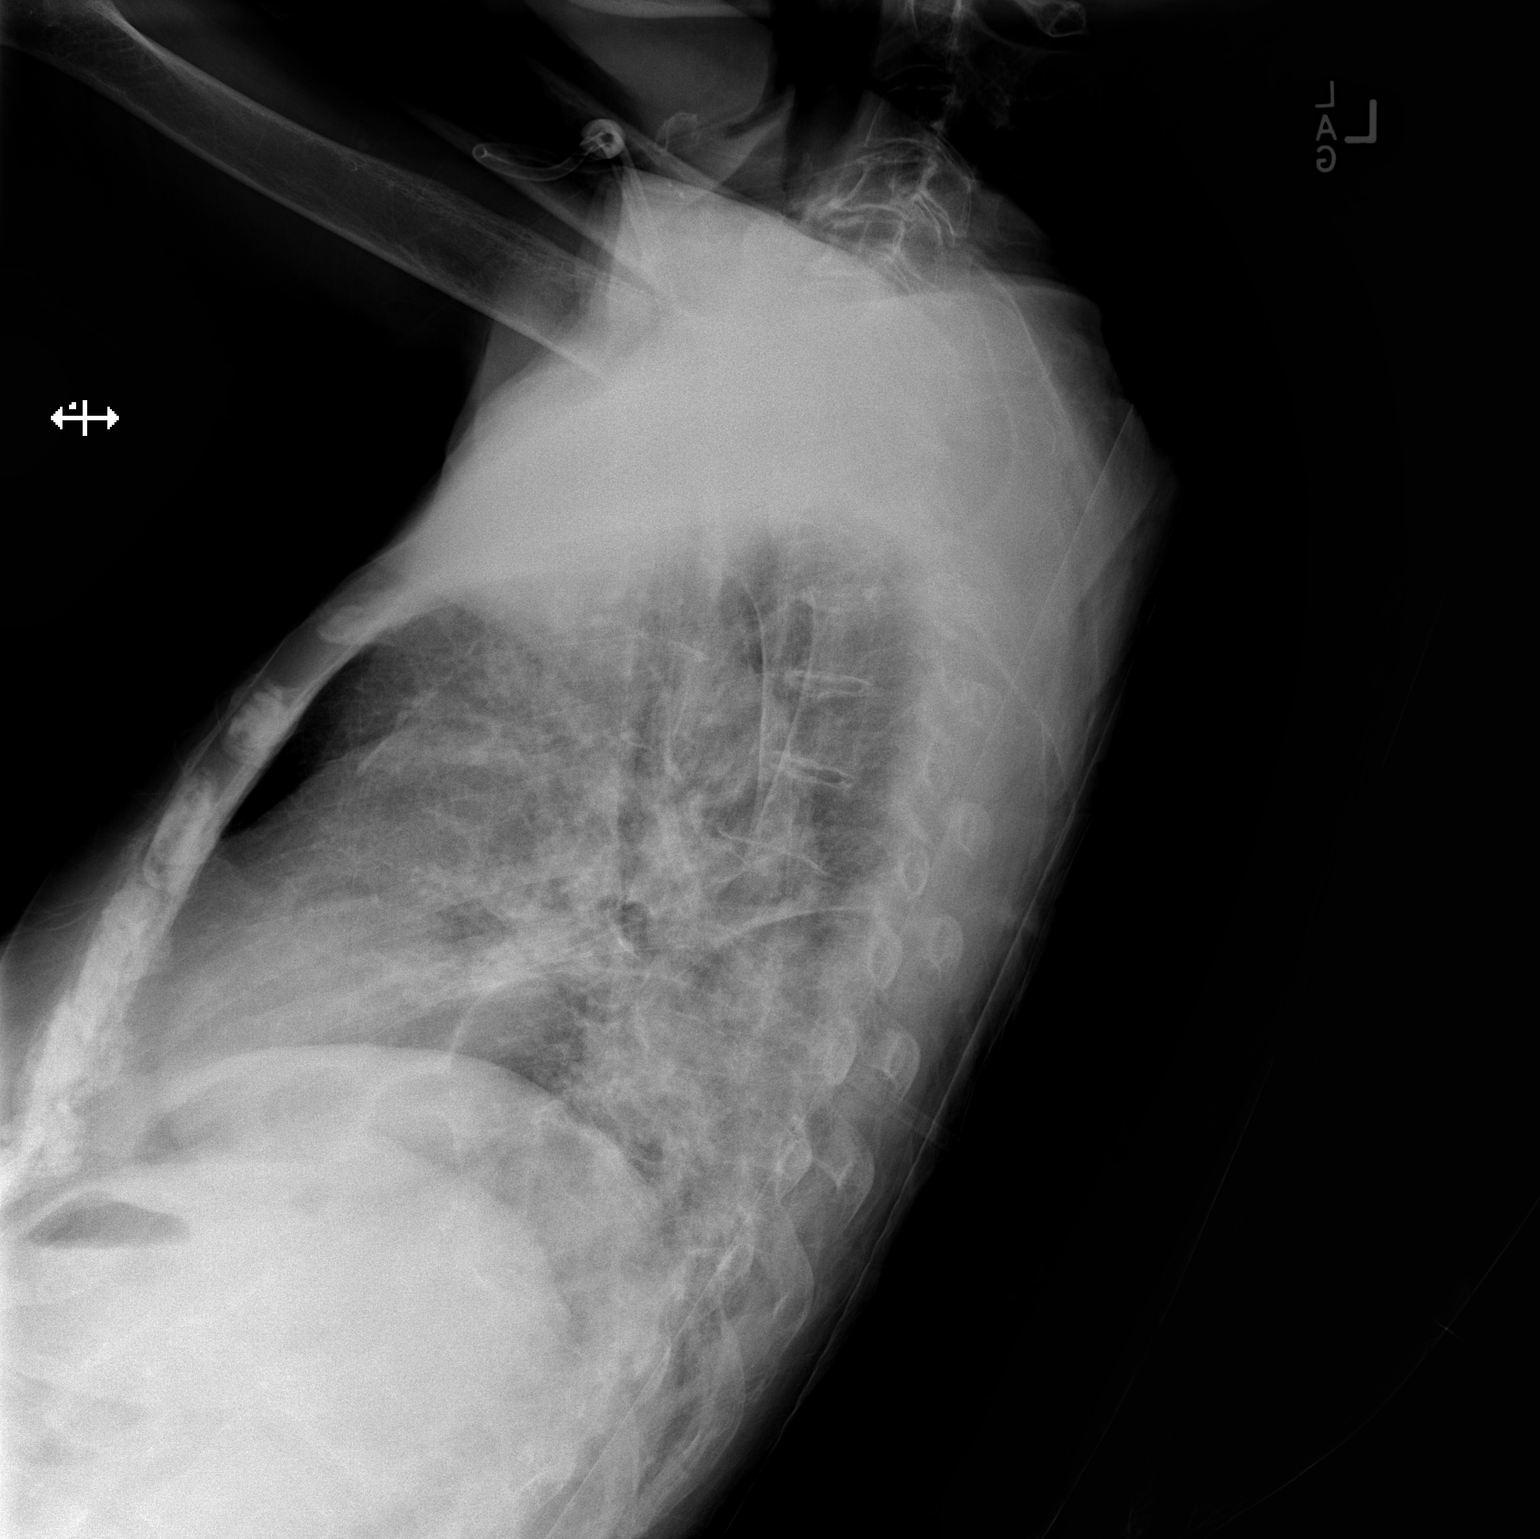

[x chest ap]
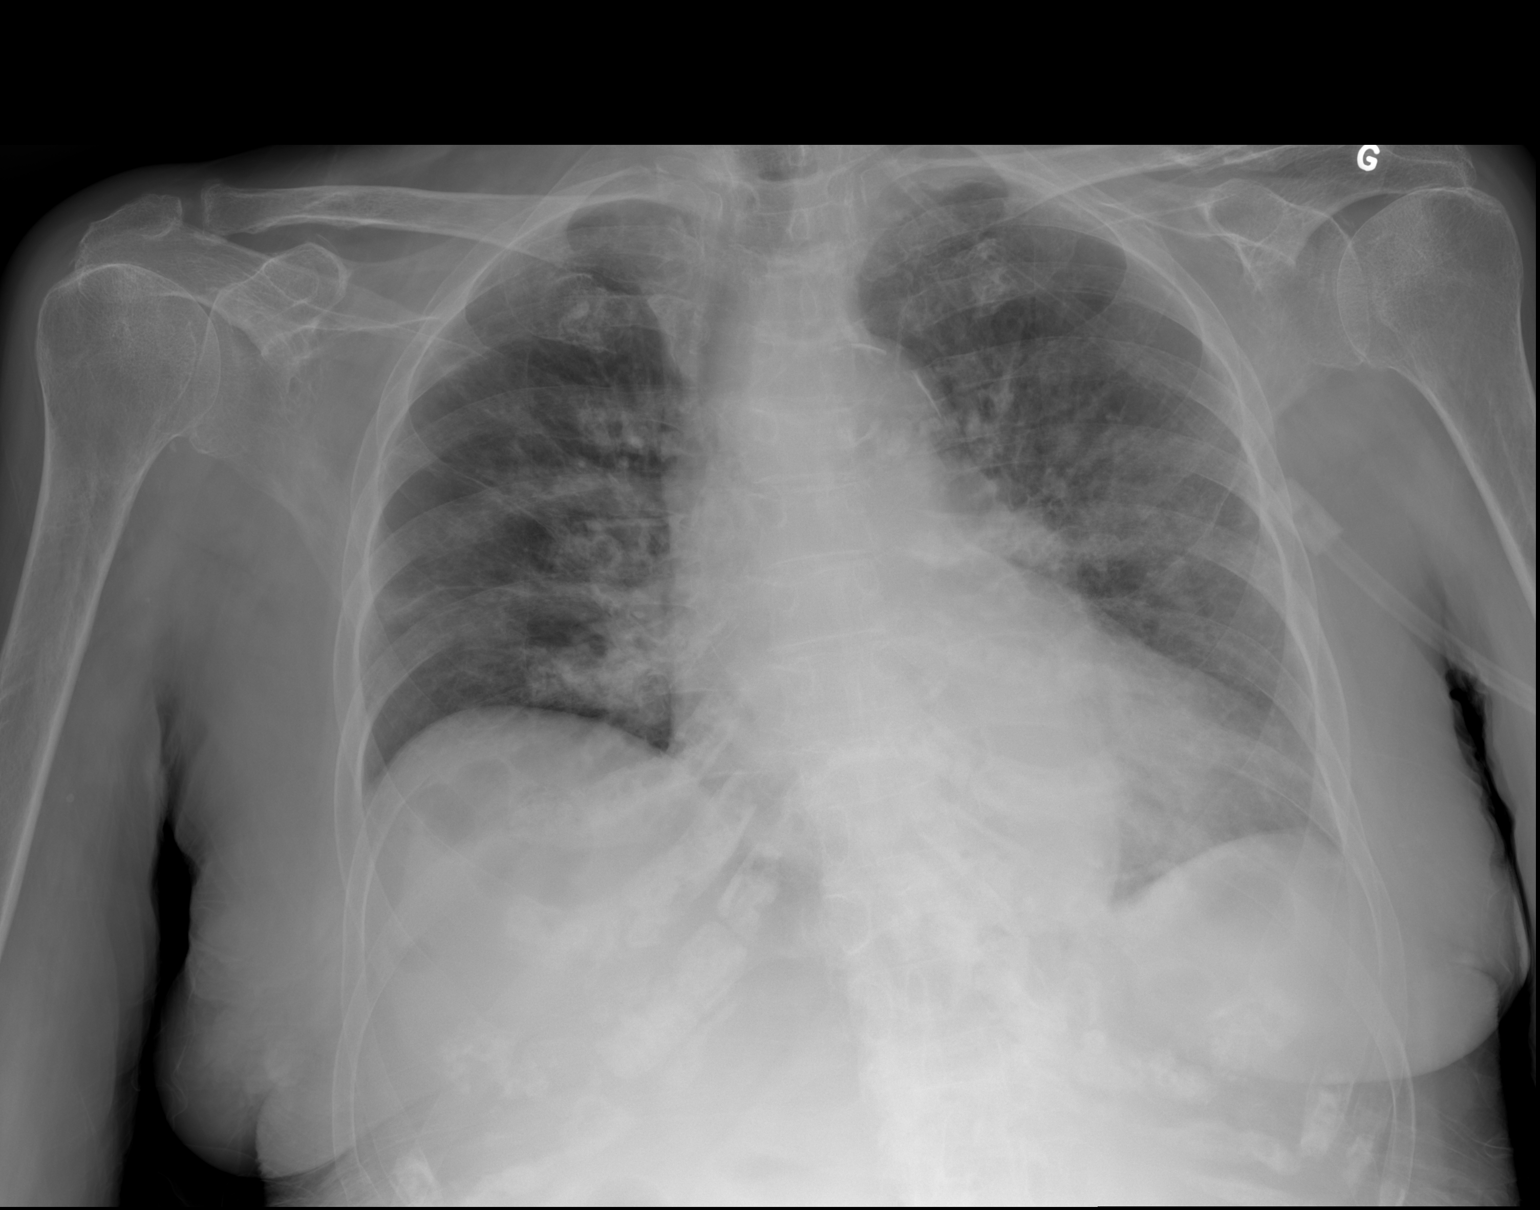

[2 of 2 positions shown; findings below may reference images not displayed]

FINDINGS: Cardiac silhouette remains moderately enlarged, moderately calcified
aortic knob. Similar interstitial prominence, with decreased
perihilar airspace opacities, improved aeration of left lung base.
Similarly elevated right hemidiaphragm. No pleural effusions.
Biapical pleural thickening. No pneumothorax.

Osteopenia.  Soft tissue planes are nonsuspicious.
IMPRESSION: Stable cardiomegaly and interstitial prominence with decreased
pulmonary edema.

  By: Amiya Morazan

## 2015-01-20 NOTE — Progress Notes (Signed)
This encounter was created in error - please disregard.
